# Patient Record
Sex: Female | Born: 1970 | Race: Black or African American | Hispanic: No | Marital: Single | Smoking: Former smoker
Health system: Southern US, Community
[De-identification: ages and names within clinical notes are randomized; demographics above are authoritative.]

## PROBLEM LIST (undated history)

## (undated) DIAGNOSIS — E119 Type 2 diabetes mellitus without complications: Secondary | ICD-10-CM

## (undated) HISTORY — PX: HERNIA REPAIR: SHX51

## (undated) NOTE — Anesthesia Postprocedure Evaluation (Signed)
 Formatting of this note is different from the original. Patient: Lela JONELLE Pouch  Procedure(s) Performed: Procedure(s): LEEP WITH COLPOSCOPY  Anesthesia Post Evaluation  Patient participation: Anesthetic effects expected to be resolved in 4-6 hours Level of consciousness: sleepy but conscious Pain score: 0 Airway patency: patent Cardiovascular status: stable Respiratory status: stable Hydration status: euvolemic Nausea and Vomiting: none Comments: Stable in PACU.  Anticipate routine post op course.  Will treat pain with iv or po narcotics as needed.  Quality metrics Patient Age: Adult 18 yrs + Postoperative nausea/vomiting risk protocol General inhalation anesthetic (NOT TIVA) with PONV risk factors: No  Multimodal Pain Medicine Use of Multimodal Pain Management, two or more drugs and/or interventions, not including systemic opioids? No Exception: Documented allergy to multiple classes of analgesics? No Other Unplanned admission to ICU (not initially anticipated at anesthesia start time)  No Patient was reintubated in PACU? No Patient experienced cardiac arrest? No Patient experienced Myocardial infarction? No  No complications documented.  Last Vitals:  Vitals Value Taken Time  BP 107/71 06/20/2019 10:53 AM  Temp 36.4 C 06/20/2019 10:16 AM  Pulse 100 06/20/2019 10:57 AM  Resp    SpO2 100 % 06/20/2019 10:57 AM  SpO2 Pulse Rate 100 06/20/2019 10:57 AM  Vitals shown include unvalidated device data.  Per GI nursing notes:     Electronically signed by Elsie KATHEE Bevel, MD at 06/20/2019 10:58 AM EDT

## (undated) NOTE — Anesthesia Preprocedure Evaluation (Signed)
 Formatting of this note is different from the original. 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 Pulmonary  + tobacco use  + smoker Osteomyelitis of left foot:  Cardiovascular  Patient has a negative cardiovascular ROS  Negative for hypertension Negative for hyperlipidemia Negative for CAD  GI/Hepatic/Renal  Patient has a negative GI/hepatic/renal ROS   Negative for GERD Hematology  Patient has a negative hematology ROS  Endo/Cancer/Other  + diabetes, insulin  dependent + obesity Diabetic foot ulcer (HCC):  Dental  Dental Issues: missing    Anesthesia Physical Exam Airway  Mallampati: I Pulmonary  Normal systems: pulmonary exam normal Cardiovascular  Normal systems: cardiovascular exam normal   Anesthesia Plan ASA Score: 3   Anesthesia Plan of Care:  general LMA Plan Monitor  Monitor: routine Postoperative Plan  Post Operative Plan: routine  Informed consent  Anesthetic Plan and risks discussed with the patient.  Comments: Risks and benefits of the planned anesthetic were discussed with the patient who understands and agrees to proceed.  All questions were invited and answered.  Elsie Bevel, MD  Relevant Problems   Stop Wynona Score/Comments: No value filed. Mini-Cog Score: No value filed. PONV Risk Score: No value filed. Frail Scale Score: No value filed.   Electronically signed by Elsie KATHEE Bevel, MD at 06/20/2019  9:03 AM EDT

## (undated) NOTE — Progress Notes (Signed)
 Formatting of this note is different from the original. Images from the original note were not included. Subjective:    Patient ID: Destiny Luna  MRN:  [id number] Date of Birth:  24-Apr-1971  Patient is a 20 y.o. female who presents for:  Colposcopy . HPI Destiny Luna is a 56 year old G4P1 031 who is here for consultation and colposcopy.  She was recently seen as a new patient.  She had her annual exam done in 5/20.  She had a HGSIL Pap.  She reports she had an abnormal Pap around 2001. She was unsure of what follow-up was done.  She is anxious about this.  She is not a smoker.  She also had recent lab work performed at her annual exam-she is here to review those results.  This lab work was done for issues with menstrual changes.  She was wondering if she was peri menopausal.  I have reviewed the patient's past medical history, social history, family history, medications and allergies, and made changes when deemed necessary.  Review of Systems See HPI  Objective:   BP (!) 156/97   Pulse 111   Ht 5' 5 (1.651 m)   Wt 266 lb 9.6 oz (121 kg)   LMP 04/29/2019 (Exact Date)   BMI 44.36 kg/m  Physical Exam  Genitourinary:   UPT negative FSH 9.8, estradiol 42, TSH 0.53 Pelvic:  Normal external female genitalia, normal vagina, normal cervix, normal uterus, normal adnexa See colposcopy sheet   Assessment & Plan     26 year old G81P1 031-recent menstrual changes and abnormal Pap smear   1. Menstrual changes-we reviewed her lab work.  We reviewed this is not consistent with menopause or perimenopause.  Her questions were answered.  She will monitor at this time.  We reviewed changes in her cycle to report.   2. HGSIL Pap-a colposcopy was performed today.  We reviewed the spectrum of abnormal Pap smears.  A biopsy and ECC were collected.  I will call her with these results.  Total visit time 20 minutes, time in counseling 15 minutes  Problem List Items Addressed This Visit     HGSIL on cytologic smear of cervix   Overview    5/20-pap HGSIL 6/20-colpo    Other Visit Diagnoses    Negative pregnancy test    -  Primary   Relevant Orders   POC Urine Pregnancy (Completed)   HGSIL (high grade squamous intraepithelial lesion) on Pap smear of cervix       Relevant Orders   Surgical Pathology (Completed)    Current Outpatient Medications  Medication Sig Dispense Refill  ? insulin  glargine (LANTUS ) 100 unit/mL (3 mL) InPn pen Inject 50 Units into the skin daily. 15 mL 0  ? semaglutide 1 mg/dose (2 mg/1.5 mL) PnIj Inject 1 mg into the skin every 7 days.Mondays     No current facility-administered medications for this visit.    Return if symptoms worsen or fail to improve.   Electronically signed by Alfonso JAYSON Munson, MD at 05/23/2019 10:10 AM EDT

---

## 2012-04-10 DIAGNOSIS — E1140 Type 2 diabetes mellitus with diabetic neuropathy, unspecified: Secondary | ICD-10-CM | POA: Insufficient documentation

## 2020-03-27 ENCOUNTER — Other Ambulatory Visit: Payer: Self-pay

## 2020-03-27 ENCOUNTER — Ambulatory Visit
Admission: EM | Admit: 2020-03-27 | Discharge: 2020-03-27 | Disposition: A | Discharge status: Home / Self Care | Payer: BC Managed Care – PPO | Attending: Emergency Medicine | Admitting: Emergency Medicine

## 2020-03-27 ENCOUNTER — Encounter: Payer: Self-pay | Admitting: Emergency Medicine

## 2020-03-27 DIAGNOSIS — Z833 Family history of diabetes mellitus: Secondary | ICD-10-CM | POA: Insufficient documentation

## 2020-03-27 DIAGNOSIS — J309 Allergic rhinitis, unspecified: Secondary | ICD-10-CM | POA: Diagnosis not present

## 2020-03-27 DIAGNOSIS — Z794 Long term (current) use of insulin: Secondary | ICD-10-CM | POA: Insufficient documentation

## 2020-03-27 DIAGNOSIS — K0889 Other specified disorders of teeth and supporting structures: Secondary | ICD-10-CM | POA: Diagnosis not present

## 2020-03-27 DIAGNOSIS — E119 Type 2 diabetes mellitus without complications: Secondary | ICD-10-CM | POA: Insufficient documentation

## 2020-03-27 DIAGNOSIS — F17210 Nicotine dependence, cigarettes, uncomplicated: Secondary | ICD-10-CM | POA: Insufficient documentation

## 2020-03-27 DIAGNOSIS — Z20822 Contact with and (suspected) exposure to covid-19: Secondary | ICD-10-CM | POA: Insufficient documentation

## 2020-03-27 DIAGNOSIS — R519 Headache, unspecified: Secondary | ICD-10-CM | POA: Diagnosis present

## 2020-03-27 HISTORY — DX: Type 2 diabetes mellitus without complications: E11.9

## 2020-03-27 LAB — SARS CORONAVIRUS 2 (TAT 6-24 HRS): SARS Coronavirus 2: NEGATIVE

## 2020-03-27 MED ORDER — AMOXICILLIN-POT CLAVULANATE 875-125 MG PO TABS
1.0000 | ORAL_TABLET | Freq: Two times a day (BID) | ORAL | 0 refills | Status: DC
Start: 2020-03-27 — End: 2020-09-26

## 2020-03-27 MED ORDER — LEVOCETIRIZINE DIHYDROCHLORIDE 5 MG PO TABS: 5.0000 mg | ORAL_TABLET | Freq: Every evening | ORAL | 0 refills | Status: DC

## 2020-03-27 MED ORDER — IBUPROFEN 600 MG PO TABS
600.0000 mg | ORAL_TABLET | Freq: Four times a day (QID) | ORAL | 0 refills | Status: DC | PRN
Start: 2020-03-27 — End: 2020-09-26

## 2020-03-27 MED ORDER — FLUTICASONE PROPIONATE 50 MCG/ACT NA SUSP
2.0000 | Freq: Every day | NASAL | 2 refills | Status: DC
Start: 2020-03-27 — End: 2020-09-26

## 2020-03-27 NOTE — ED Triage Notes (Signed)
Patient in today c/o sinus pressure/pain and congestion x 4 days. Patient states her symptoms are mainly on her left side. Patient has taken OTC Mucinex and Xyzal. Patient states the Xyzal helped the most. Patient denies fever. Patient denies any other symptoms.

## 2020-03-27 NOTE — Discharge Instructions (Addendum)
I have written a prescription of Xyzal, it may be cheaper getting it through insurance rather than buying over-the-counter.  Use the Flonase, saline nasal irrigation with a Lloyd Huger med rinse and distilled water as often as you want to reduce allergy symptoms and to wash out infection, add 600 mg of ibuprofen to the Tylenol that you are already taking.  You may take this combination 3 or 4 times a day as needed for pain.  Wait-and-see prescription of Augmentin.  Try giving the Xyzal, Flonase, saline nasal irrigation 24 hours to work and if your symptoms resolve, then you can hold off on the antibiotics.  If you continue to have problems, then start the Augmentin.   Here is a list of primary care providers who are taking new patients:  Dr. Elizabeth Sauer, Dr. Schuyler Amor [address] [place] [address] [phone]  [facility] [address] [place] [address]  [phone]  [facility] [address] [place], [place] [address] [phone]  [facility] [address] [place]  [phone] [place], [place] [address]  Here are clinics/ other resources who will see you if you do not have insurance. Some have certain criteria that you must meet. Call them and find out what they are:  [facility]: [address] [place], [place] [address] Phone: [phone] Hours: First and Third Saturdays of each Month, 9 a.m. - 1 p.m.  [facility]: [address], [address] [place], [place] [address] Phone: [phone] Hours: Tuesday, 4 p.m. - 8 p.m. Thursday, 1 p.m. - 8 p.m. Wednesday, 9 a.m. - [facility] [address], [place], [place] [address] Phone: [phone] Pharmacy Phone Number: [phone] Dental Phone Number: [phone] [facility] Insurance Help: [phone]  Dental Hours: Monday - Thursday, 8 a.m. - 6 p.m.  [facility] [address] [place], [place] [address] Phone:  [phone] Pharmacy Phone Number: [phone] [facility] Insurance Help: [phone]  [facility] [address] [place] [place], [place] [address] Phone: [phone] Pharmacy Phone Number: [phone] [facility] Insurance Help: [phone]  [facility] [address] [place], [place] [address] Phone: [phone] [facility] Insurance Help: [phone]   [facility] [address], [place], [place] [address] Phone: [phone]  Go to www.goodrx.com to look up your medications. This will give you a list of where you can find your prescriptions at the most affordable prices. Or ask the pharmacist what the cash price is, or if they have any other discount programs available to help make your medication more affordable. This can be less expensive than what you would pay with insurance.

## 2020-03-27 NOTE — ED Provider Notes (Signed)
HPI  SUBJECTIVE:  Destiny Luna is a 49 y.o. female who presents with 4 days of left sinus pain and pressure, left-sided headache described as throbbing.  She reports upper dental pain, itchy, watery eyes.  She reports swelling underneath her left eye this morning.  No other facial swelling.  No nasal congestion, rhinorrhea, fevers.  No sneezing.  No body aches, loss of sense of smell or taste, sore throat, cough, shortness of breath, nausea, vomiting, diarrhea, abdominal pain.  No known exposure to Covid.  She has not yet gotten the Covid vaccine.  States that she has been walking 2 hours a day, and her symptoms get worse when she is outside on high pollen days.  She has tried Xyzal and 500 mg of Tylenol 3 times daily with significant improvement in her symptoms.  Symptoms are also worse with bending forward, lying down and being outside.  No antibiotics in the past month.  No antipyretic in the past 4 to 6 hours.  Past medical history of diabetes.  No history of hypertension, allergies, frequent sinusitis.  LMP: 2 weeks ago.  Denies the possibility of being pregnant.  PMD: Dr. Vivia Birmingham in [facility].    Past Medical History:  Diagnosis Date  . Diabetes mellitus without complication [facility])     Past Surgical History:  Procedure Laterality Date  . CESAREAN SECTION    . HERNIA REPAIR      Family History  Problem Relation Age of Onset  . Diabetes Mother   . CAD Father        CABG x 3  . Stroke Father   . Diabetes Father     Social History   Tobacco Use  . Smoking status: Current Every Day Smoker    Packs/day: 0.25    Years: 4.00    Pack years: 1.00    Types: Cigarettes  . Smokeless tobacco: Never Used  . Tobacco comment: quit for 8 years and restarted  Substance Use Topics  . Alcohol use: Yes    Comment: rarely  . Drug use: Never    No current facility-administered medications for this encounter.  Current Outpatient Medications:  .  insulin glargine (LANTUS)  100 UNIT/ML injection, Inject 25 Units into the skin daily. 25-30 units, Disp: , Rfl:  .  Semaglutide (OZEMPIC, 1 MG/DOSE, [place]), Inject 1 each into the skin once a week., Disp: , Rfl:  .  amoxicillin-clavulanate (AUGMENTIN) 875-125 MG tablet, Take 1 tablet by mouth 2 (two) times daily. X 7 days, Disp: 14 tablet, Rfl: 0 .  fluticasone (FLONASE) 50 MCG/ACT nasal spray, Place 2 sprays into both nostrils daily., Disp: 16 g, Rfl: 2 .  ibuprofen (ADVIL) 600 MG tablet, Take 1 tablet (600 mg total) by mouth every 6 (six) hours as needed., Disp: 30 tablet, Rfl: 0 .  levocetirizine (XYZAL) 5 MG tablet, Take 1 tablet (5 mg total) by mouth every evening., Disp: 30 tablet, Rfl: 0  No Known Allergies   ROS  As noted in HPI.   Physical Exam  BP (!) 136/93 (BP Location: Left Arm)   Pulse (!) 107   Temp 98.9 F (37.2 C) (Oral)   Resp 18   Ht 5' 5.5" (1.664 m)   Wt 113.4 kg   LMP 03/13/2020 (Approximate)   SpO2 98%   BMI 40.97 kg/m   Constitutional: Well developed, well nourished, no acute distress Eyes:  EOMI, conjunctiva normal bilaterally HENT: Normocephalic, atraumatic,mucus membranes moist.  Normal turbinates.  No frontal sinus  tenderness.  Positive left-sided maxillary sinus tenderness.  No maxillary sinus tenderness right side.  Positive mucoid nasal congestion left side. Respiratory: Normal inspiratory effort Cardiovascular: Normal rate GI: nondistended skin: No rash, skin intact Musculoskeletal: no deformities Neurologic: Alert & oriented x 3, no focal neuro deficits Psychiatric: Speech and behavior appropriate   ED Course   Medications - No data to display  Orders Placed This Encounter  Procedures  . SARS CORONAVIRUS 2 (TAT 6-24 HRS) Nasopharyngeal Nasopharyngeal Swab    Standing Status:   Standing    Number of Occurrences:   1    Order Specific Question:   Is this test for diagnosis or screening    Answer:   Diagnosis of ill patient    Order Specific Question:    Symptomatic for COVID-19 as defined by CDC    Answer:   Yes    Order Specific Question:   Date of Symptom Onset    Answer:   03/23/2020    Order Specific Question:   Hospitalized for COVID-19    Answer:   No    Order Specific Question:   Admitted to ICU for COVID-19    Answer:   No    Order Specific Question:   Previously tested for COVID-19    Answer:   No    Order Specific Question:   Resident in a congregate (group) care setting    Answer:   No    Order Specific Question:   Employed in healthcare setting    Answer:   No    Order Specific Question:   Pregnant    Answer:   No    Order Specific Question:   Has patient completed COVID vaccination(s) (2 doses of Pfizer/Moderna 1 dose of [employer])    Answer:   No    No results found for this or any previous visit (from the past 24 hour(s)). No results found.  ED Clinical Impression  1. Allergic sinusitis      ED Assessment/Plan  Covid PCR sent.  Discussed with patient that we will contact her only if positive.  Patient with a left-sided sinusitis, seems to be caused by allergies.  She responds well to the Xyzal also we will have her continue this 5 mg/day.  Sending home with Flonase, saline nasal irrigation with a Milta Deiters med rinse and distilled water, ibuprofen 600 mg to add to the Tylenol 3-4 times a day as needed.  Wait-and-see prescription of Augmentin.  She will give the medication 24 hours to work, and if her symptoms resolve, then she can hold off on the antibiotics, however if she continues to have symptoms, she is to start the Augmentin.  Will provide primary care list.   Discussed  MDM, treatment plan, and plan for follow-up with patient. Discussed sn/sx that should prompt return to the ED. patient agrees with plan.   Meds ordered this encounter  Medications  . levocetirizine (XYZAL) 5 MG tablet    Sig: Take 1 tablet (5 mg total) by mouth every evening.    Dispense:  30 tablet    Refill:  0  . fluticasone  (FLONASE) 50 MCG/ACT nasal spray    Sig: Place 2 sprays into both nostrils daily.    Dispense:  16 g    Refill:  2  . amoxicillin-clavulanate (AUGMENTIN) 875-125 MG tablet    Sig: Take 1 tablet by mouth 2 (two) times daily. X 7 days    Dispense:  14 tablet  Refill:  0  . ibuprofen (ADVIL) 600 MG tablet    Sig: Take 1 tablet (600 mg total) by mouth every 6 (six) hours as needed.    Dispense:  30 tablet    Refill:  0    *This clinic note was created using Scientist, clinical (histocompatibility and immunogenetics). Therefore, there may be occasional mistakes despite careful proofreading.   ?    Domenick Gong, MD 03/27/20 1216

## 2020-04-27 ENCOUNTER — Emergency Department: Payer: BC Managed Care – PPO

## 2020-04-27 ENCOUNTER — Encounter: Payer: Self-pay | Admitting: Emergency Medicine

## 2020-04-27 DIAGNOSIS — F17210 Nicotine dependence, cigarettes, uncomplicated: Secondary | ICD-10-CM | POA: Insufficient documentation

## 2020-04-27 DIAGNOSIS — U071 COVID-19: Secondary | ICD-10-CM | POA: Diagnosis not present

## 2020-04-27 DIAGNOSIS — Z794 Long term (current) use of insulin: Secondary | ICD-10-CM | POA: Insufficient documentation

## 2020-04-27 DIAGNOSIS — Z79899 Other long term (current) drug therapy: Secondary | ICD-10-CM | POA: Insufficient documentation

## 2020-04-27 DIAGNOSIS — E1165 Type 2 diabetes mellitus with hyperglycemia: Secondary | ICD-10-CM | POA: Insufficient documentation

## 2020-04-27 DIAGNOSIS — R5383 Other fatigue: Secondary | ICD-10-CM | POA: Diagnosis present

## 2020-04-27 NOTE — ED Triage Notes (Signed)
Pt c/o cough, nasal congestion, HA, general malaise x5 days with no relief by OTC medications. Pt denies SOB and chest pain.

## 2020-04-28 ENCOUNTER — Emergency Department
Admission: EM | Admit: 2020-04-28 | Discharge: 2020-04-28 | Disposition: A | Discharge status: Home / Self Care | Payer: BC Managed Care – PPO | Attending: Emergency Medicine | Admitting: Emergency Medicine

## 2020-04-28 DIAGNOSIS — U071 COVID-19: Secondary | ICD-10-CM

## 2020-04-28 DIAGNOSIS — E1165 Type 2 diabetes mellitus with hyperglycemia: Secondary | ICD-10-CM

## 2020-04-28 LAB — COMPREHENSIVE METABOLIC PANEL
ALT: 14 U/L (ref 0–44)
AST: 13 U/L — ABNORMAL LOW (ref 15–41)
Albumin: 3.2 g/dL — ABNORMAL LOW (ref 3.5–5.0)
Alkaline Phosphatase: 79 U/L (ref 38–126)
Anion gap: 11 (ref 5–15)
BUN: 14 mg/dL (ref 6–20)
CO2: 21 mmol/L — ABNORMAL LOW (ref 22–32)
Calcium: 7.9 mg/dL — ABNORMAL LOW (ref 8.9–10.3)
Chloride: 94 mmol/L — ABNORMAL LOW (ref 98–111)
Creatinine, Ser: 0.92 mg/dL (ref 0.44–1.00)
GFR calc Af Amer: >60 mL/min (ref >60)
GFR calc non Af Amer: >60 mL/min (ref >60)
Glucose, Bld: 436 mg/dL — ABNORMAL HIGH (ref 70–99)
Potassium: 3.8 mmol/L (ref 3.5–5.1)
Sodium: 126 mmol/L — ABNORMAL LOW (ref 135–145)
Total Bilirubin: 0.9 mg/dL (ref 0.3–1.2)
Total Protein: 6.8 g/dL (ref 6.5–8.1)

## 2020-04-28 LAB — CBC WITH DIFFERENTIAL/PLATELET
Abs Immature Granulocytes: 0.03 10*3/uL (ref 0.00–0.07)
Basophils Absolute: 0 10*3/uL (ref 0.0–0.1)
Basophils Relative: 0 %
Eosinophils Absolute: 0 10*3/uL (ref 0.0–0.5)
Eosinophils Relative: 0 %
HCT: 41.8 % (ref 36.0–46.0)
Hemoglobin: 13.6 g/dL (ref 12.0–15.0)
Immature Granulocytes: 0 %
Lymphocytes Relative: 23 %
Lymphs Abs: 1.6 10*3/uL (ref 0.7–4.0)
MCH: 26.9 pg (ref 26.0–34.0)
MCHC: 32.5 g/dL (ref 30.0–36.0)
MCV: 82.6 fL (ref 80.0–100.0)
Monocytes Absolute: 0.6 10*3/uL (ref 0.1–1.0)
Monocytes Relative: 9 %
Neutro Abs: 4.8 10*3/uL (ref 1.7–7.7)
Neutrophils Relative %: 68 %
Platelets: 178 10*3/uL (ref 150–400)
RBC: 5.06 MIL/uL (ref 3.87–5.11)
RDW: 13.1 % (ref 11.5–15.5)
WBC: 7.1 10*3/uL (ref 4.0–10.5)
nRBC: 0 % (ref 0.0–0.2)

## 2020-04-28 LAB — GLUCOSE, CAPILLARY
Glucose-Capillary: 396 mg/dL — ABNORMAL HIGH (ref 70–99)
Glucose-Capillary: 366 mg/dL — ABNORMAL HIGH (ref 70–99)
Glucose-Capillary: 279 mg/dL — ABNORMAL HIGH (ref 70–99)

## 2020-04-28 LAB — PROCALCITONIN: Procalcitonin: <0.1 ng/mL

## 2020-04-28 LAB — SARS CORONAVIRUS 2 BY RT PCR (HOSPITAL ORDER, PERFORMED IN ~~LOC~~ HOSPITAL LAB): SARS Coronavirus 2: POSITIVE — AB

## 2020-04-28 MED ORDER — ACETAMINOPHEN 500 MG PO TABS
1000.0000 mg | ORAL_TABLET | Freq: Once | ORAL | Status: AC
  Administered 2020-04-28: 1000 mg via ORAL
  Filled 2020-04-28: qty 2

## 2020-04-28 MED ORDER — INSULIN ASPART 100 UNIT/ML ~~LOC~~ SOLN
10.0000 [IU] | Freq: Once | SUBCUTANEOUS | Status: AC
  Administered 2020-04-28: 10 [IU] via INTRAVENOUS
  Filled 2020-04-28: qty 1

## 2020-04-28 MED ORDER — LACTATED RINGERS IV BOLUS
1000.0000 mL | Freq: Once | INTRAVENOUS | Status: AC
  Administered 2020-04-28: 1000 mL via INTRAVENOUS

## 2020-04-28 NOTE — ED Notes (Signed)
Dr Don Perking notified of Positive COVID

## 2020-04-28 NOTE — ED Notes (Signed)
CBG 366 

## 2020-04-28 NOTE — ED Notes (Signed)
Pt assisted to the restroom at this time. ?

## 2020-04-28 NOTE — ED Notes (Signed)
Pt st she needs to leave right now and cannot wait since her ride has been in the car all night long. Pt asked this RN to please remove the IV.  IV removed at this time @0513    Pt does not want to wait for d/c papers/ or have VS re-taken. MD made aware

## 2020-04-28 NOTE — ED Provider Notes (Addendum)
[facility] Emergency Department Provider Note  ____________________________________________  Time seen: Approximately 1:34 AM  I have reviewed the triage vital signs and the nursing notes.   HISTORY  Chief Complaint Fatigue, Cough, and Diarrhea   HPI Destiny Luna is a 49 y.o. female with a history of diabetes who presents for evaluation of Covid-like symptoms x5 days.  No known exposure.  Patient is complaining of fever, headache, malaise, body aches, congestion, cough, sore throat, diarrhea.  Her symptoms have been constant and moderate in intensity.  No chest pain or shortness of breath, no abdominal pain, no nausea or vomiting.  No Covid vaccination.   Past Medical History:  Diagnosis Date  . Diabetes mellitus without complication [facility])     Past Surgical History:  Procedure Laterality Date  . CESAREAN SECTION    . HERNIA REPAIR      Prior to Admission medications   Medication Sig Start Date End Date Taking? Authorizing Provider  amoxicillin-clavulanate (AUGMENTIN) 875-125 MG tablet Take 1 tablet by mouth 2 (two) times daily. X 7 days 03/27/20   Domenick Gong, MD  fluticasone [facility]) 50 MCG/ACT nasal spray Place 2 sprays into both nostrils daily. 03/27/20   Domenick Gong, MD  ibuprofen (ADVIL) 600 MG tablet Take 1 tablet (600 mg total) by mouth every 6 (six) hours as needed. 03/27/20   Domenick Gong, MD  insulin glargine (LANTUS) 100 UNIT/ML injection Inject 25 Units into the skin daily. 25-30 units    [provider]  levocetirizine (XYZAL) 5 MG tablet Take 1 tablet (5 mg total) by mouth every evening. 03/27/20 04/26/20  Domenick Gong, MD  Semaglutide (OZEMPIC, 1 MG/DOSE, [place]) Inject 1 each into the skin once a week.    [provider]    Allergies Patient has no known allergies.  Family History  Problem Relation Age of Onset  . Diabetes Mother   . CAD Father        CABG x 3  . Stroke Father   . Diabetes  Father     Social History Social History   Tobacco Use  . Smoking status: Current Every Day Smoker    Packs/day: 0.25    Years: 4.00    Pack years: 1.00    Types: Cigarettes  . Smokeless tobacco: Never Used  . Tobacco comment: quit for 8 years and restarted  Substance Use Topics  . Alcohol use: Yes    Comment: rarely  . Drug use: Never    Review of Systems  Constitutional: + fever, malaise, body aches Eyes: Negative for visual changes. ENT: + sore throat, congestion Neck: No neck pain  Cardiovascular: Negative for chest pain. Respiratory: Negative for shortness of breath. + cough Gastrointestinal: Negative for abdominal pain, vomiting. + diarrhea. Genitourinary: Negative for dysuria. Musculoskeletal: Negative for back pain. Skin: Negative for rash. Neurological: Negative for weakness or numbness. + HA Psych: No SI or HI  ____________________________________________   PHYSICAL EXAM:  VITAL SIGNS: ED Triage Vitals  Enc Vitals Group     BP 04/27/20 2121 125/77     Pulse Rate 04/27/20 2121 (!) 113     Resp 04/27/20 2125 18     Temp 04/27/20 2121 (!) 100.6 F (38.1 C)     Temp Source 04/27/20 2121 Oral     SpO2 04/27/20 2121 96 %     Weight 04/27/20 2121 240 lb (108.9 kg)     Height 04/27/20 2121 5\' 5"  (1.651 m)     Head Circumference --  Peak Flow --      Pain Score --      Pain Loc --      Pain Edu? --      Excl. in [place]? --     Constitutional: Alert and oriented. Well appearing and in no apparent distress. HEENT:      Head: Normocephalic and atraumatic.         Eyes: Conjunctivae are normal. Sclera is non-icteric.       Mouth/Throat: Mucous membranes are moist.       Neck: Supple with no signs of meningismus. Cardiovascular: Tachycardic with regular rhythm. Respiratory: Normal respiratory effort. Lungs are clear to auscultation bilaterally. No wheezes, crackles, or rhonchi.  Gastrointestinal: Soft, non tender, and non distended with positive bowel  sounds. Musculoskeletal:  No edema, cyanosis, or erythema of extremities. Neurologic: Normal speech and language. Face is symmetric. Moving all extremities. No gross focal neurologic deficits are appreciated. Skin: Skin is warm, dry and intact. No rash noted. Psychiatric: Mood and affect are normal. Speech and behavior are normal.  ____________________________________________   LABS (all labs ordered are listed, but only abnormal results are displayed)  Labs Reviewed  SARS CORONAVIRUS 2 BY RT PCR (HOSPITAL ORDER, [facility]) - Abnormal; Notable for the following components:      Result Value   SARS Coronavirus 2 POSITIVE (*)    All other components within normal limits  COMPREHENSIVE METABOLIC PANEL - Abnormal; Notable for the following components:   Sodium 126 (*)    Chloride 94 (*)    CO2 21 (*)    Glucose, Bld 436 (*)    Calcium 7.9 (*)    Albumin 3.2 (*)    AST 13 (*)    All other components within normal limits  GLUCOSE, CAPILLARY - Abnormal; Notable for the following components:   Glucose-Capillary 396 (*)    All other components within normal limits  GLUCOSE, CAPILLARY - Abnormal; Notable for the following components:   Glucose-Capillary 366 (*)    All other components within normal limits  GLUCOSE, CAPILLARY - Abnormal; Notable for the following components:   Glucose-Capillary 279 (*)    All other components within normal limits  CBC WITH DIFFERENTIAL/PLATELET  PROCALCITONIN   ____________________________________________  EKG  none  ____________________________________________  RADIOLOGY  I have personally reviewed the images performed during this visit and I agree with the Radiologist's read.   Interpretation by Radiologist:  DG Chest 2 View  Result Date: 04/27/2020 CLINICAL DATA:  Cough for several days EXAM: CHEST - 2 VIEW COMPARISON:  None. FINDINGS: Cardiac shadows within normal limits. The lungs are well aerated bilaterally.  Minimal lingular infiltrate is seen. No sizable effusion is noted. No bony abnormality is noted. IMPRESSION: Mild lingular infiltrate. Electronically Signed   By: Inez Catalina M.D.   On: 04/27/2020 22:01     ____________________________________________   PROCEDURES  Procedure(s) performed:yes .1-3 Lead EKG Interpretation Performed by: Rudene Re, MD Authorized by: Rudene Re, MD     Interpretation: normal     ECG rate assessment: normal     Rhythm: sinus rhythm     Ectopy: none     Critical Care performed:  None ____________________________________________   INITIAL IMPRESSION / ASSESSMENT AND PLAN / ED COURSE  49 y.o. female with a history of diabetes who presents for evaluation of Covid-like symptoms x5 days.  Patient is well-appearing and in no distress.  Initially mild tachycardic and febrile however tachycardia resolved once patient defervesced.  normal work of breathing and normal sats both at rest and with ambulation.  Exam is nonfocal.  Chest x-ray interpreted by me as a left lingular infiltrate, confirmed by radiology.  Patient does not meet sepsis criteria.  Normal white count.  Labs otherwise pending including chemistry panel and procalcitonin.  Covid swab pending.  Will give IV fluids and Tylenol for symptom relief.  Old medical records reviewed.  _________________________ 5:16 AM on 04/28/2020 ----------------------------------------- Positive for Covid.  Procalcitonin negative therefore patient was not started on antibiotics.  She remained with no shortness of breath or chest pain, normal work of breathing, normal sats.  Labs showing hyperglycemia no evidence of DKA.  Sugar trend down nicely after fluids and insulin.  Discussed quarantine and supportive care with patient and follow-up with PCP.  Discussed my standard return precautions.  Discussed oxygen monitoring at home and quarantine     _____________________________________________ Please note:   Patient was evaluated in Emergency Department today for the symptoms described in the history of present illness. Patient was evaluated in the context of the global COVID-19 pandemic, which necessitated consideration that the patient might be at risk for infection with the SARS-CoV-2 virus that causes COVID-19. Institutional protocols and algorithms that pertain to the evaluation of patients at risk for COVID-19 are in a state of rapid change based on information released by regulatory bodies including the CDC and federal and state organizations. These policies and algorithms were followed during the patient's care in the ED.  Some ED evaluations and interventions may be delayed as a result of limited staffing during the pandemic.   [place] Controlled Substance Database was reviewed by me. ____________________________________________   FINAL CLINICAL IMPRESSION(S) / ED DIAGNOSES   Final diagnoses:  COVID-19  Type 2 diabetes mellitus with hyperglycemia, unspecified whether long term insulin use (HCC)      NEW MEDICATIONS STARTED DURING THIS VISIT:  ED Discharge Orders    None       Note:  This document was prepared using Dragon voice recognition software and may include unintentional dictation errors.    Don Perking, [place], MD 04/28/20 [id number]    Nita Sickle, MD 04/28/20 [phone]

## 2020-04-28 NOTE — ED Notes (Signed)
Pt c/o cough, congestion, nasal congestion x5 days.  Dr. Don Perking at bedside.  Pt in NAD at this time

## 2020-04-29 ENCOUNTER — Other Ambulatory Visit: Payer: Self-pay | Admitting: Physician Assistant

## 2020-04-29 ENCOUNTER — Ambulatory Visit (HOSPITAL_COMMUNITY)
Admission: RE | Admit: 2020-04-29 | Discharge: 2020-04-29 | Disposition: A | Discharge status: Home / Self Care | Payer: BC Managed Care – PPO | Source: Ambulatory Visit | Attending: Pulmonary Disease | Admitting: Pulmonary Disease

## 2020-04-29 DIAGNOSIS — U071 COVID-19: Secondary | ICD-10-CM

## 2020-04-29 DIAGNOSIS — E119 Type 2 diabetes mellitus without complications: Secondary | ICD-10-CM

## 2020-04-29 MED ORDER — SODIUM CHLORIDE 0.9 % IV SOLN
Freq: Once | INTRAVENOUS | Status: DC
  Filled 2020-04-29: qty 20

## 2020-04-29 NOTE — Progress Notes (Signed)
  I connected by phone with Destiny Luna on 04/29/2020 at 7:41 AM to discuss the potential use of an new treatment for mild to moderate COVID-19 viral infection in non-hospitalized patients.  This patient is a 49 y.o. female that meets the FDA criteria for Emergency Use Authorization of bamlanivimab/etesevimab or casirivimab/imdevimab.  Has a (+) direct SARS-CoV-2 viral test result  Has mild or moderate COVID-19   Is NOT hospitalized due to COVID-19  Is within 10 days of symptom onset  Has at least one of the high risk factor(s) for progression to severe COVID-19 and/or hospitalization as defined in EUA.  Specific high risk criteria : Diabetes, BMI >25   I have spoken and communicated the following to the patient or parent/caregiver:  1. FDA has authorized the emergency use of bamlanivimab/etesevimab and casirivimab\imdevimab for the treatment of mild to moderate COVID-19 in adults and pediatric patients with positive results of direct SARS-CoV-2 viral testing who are 47 years of age and older weighing at least 40 kg, and who are at high risk for progressing to severe COVID-19 and/or hospitalization.  2. The significant known and potential risks and benefits of bamlanivimab/etesevimab and casirivimab\imdevimab, and the extent to which such potential risks and benefits are unknown.  3. Information on available alternative treatments and the risks and benefits of those alternatives, including clinical trials.  4. Patients treated with bamlanivimab/etesevimab and casirivimab\imdevimab should continue to self-isolate and use infection control measures (e.g., wear mask, isolate, social distance, avoid sharing personal items, clean and disinfect "high touch" surfaces, and frequent handwashing) according to CDC guidelines.   5. The patient or parent/caregiver has the option to accept or refuse bamlanivimab/etesevimab or casirivimab\imdevimab .  After reviewing this information with the  patient, The patient agreed to proceed with receiving the bamlanivimab/etesevimab infusion and will be provided a copy of the Fact sheet prior to receiving the infusion..  Sx onset 5/27. Set up for infusion this afternoon at 12:30pm. Directions given  Cline Crock 04/29/2020 7:41 AM

## 2020-04-30 ENCOUNTER — Ambulatory Visit (HOSPITAL_COMMUNITY)
Admission: RE | Admit: 2020-04-30 | Discharge: 2020-04-30 | Disposition: A | Discharge status: Home / Self Care | Payer: BC Managed Care – PPO | Source: Ambulatory Visit | Attending: Pulmonary Disease | Admitting: Pulmonary Disease

## 2020-04-30 DIAGNOSIS — U071 COVID-19: Secondary | ICD-10-CM | POA: Insufficient documentation

## 2020-04-30 DIAGNOSIS — E119 Type 2 diabetes mellitus without complications: Secondary | ICD-10-CM | POA: Insufficient documentation

## 2020-04-30 MED ORDER — METHYLPREDNISOLONE SODIUM SUCC 125 MG IJ SOLR: 125.0000 mg | Freq: Once | INTRAMUSCULAR | Status: DC | PRN

## 2020-04-30 MED ORDER — EPINEPHRINE 0.3 MG/0.3ML IJ SOAJ: 0.3000 mg | Freq: Once | INTRAMUSCULAR | Status: DC | PRN

## 2020-04-30 MED ORDER — SODIUM CHLORIDE 0.9 % IV SOLN: INTRAVENOUS | Status: DC | PRN

## 2020-04-30 MED ORDER — FAMOTIDINE IN NACL 20-0.9 MG/50ML-% IV SOLN: 20.0000 mg | Freq: Once | INTRAVENOUS | Status: DC | PRN

## 2020-04-30 MED ORDER — ALBUTEROL SULFATE HFA 108 (90 BASE) MCG/ACT IN AERS: 2.0000 | INHALATION_SPRAY | Freq: Once | RESPIRATORY_TRACT | Status: DC | PRN

## 2020-04-30 MED ORDER — DIPHENHYDRAMINE HCL 50 MG/ML IJ SOLN: 50.0000 mg | Freq: Once | INTRAMUSCULAR | Status: DC | PRN

## 2020-04-30 MED ORDER — SODIUM CHLORIDE 0.9 % IV SOLN
Freq: Once | INTRAVENOUS | Status: AC
  Filled 2020-04-30: qty 20

## 2020-04-30 NOTE — Progress Notes (Signed)
  Diagnosis: COVID-19  Physician:Dr Wright  Procedure: Covid Infusion Clinic Med: bamlanivimab\etesevimab infusion - Provided patient with bamlanimivab\etesevimab fact sheet for patients, parents and caregivers prior to infusion.  Complications: No immediate complications noted.  Discharge: Discharged home   Destiny Luna 04/30/2020  

## 2020-04-30 NOTE — Discharge Instructions (Signed)

## 2020-06-02 ENCOUNTER — Encounter: Payer: Self-pay | Admitting: Podiatry

## 2020-06-02 ENCOUNTER — Ambulatory Visit (INDEPENDENT_AMBULATORY_CARE_PROVIDER_SITE_OTHER): Payer: BC Managed Care – PPO | Admitting: Podiatry

## 2020-06-02 ENCOUNTER — Other Ambulatory Visit: Payer: Self-pay

## 2020-06-02 DIAGNOSIS — E1165 Type 2 diabetes mellitus with hyperglycemia: Secondary | ICD-10-CM

## 2020-06-02 DIAGNOSIS — IMO0002 Reserved for concepts with insufficient information to code with codable children: Secondary | ICD-10-CM

## 2020-06-02 DIAGNOSIS — L84 Corns and callosities: Secondary | ICD-10-CM | POA: Diagnosis not present

## 2020-06-02 DIAGNOSIS — E118 Type 2 diabetes mellitus with unspecified complications: Secondary | ICD-10-CM | POA: Diagnosis not present

## 2020-06-02 NOTE — Progress Notes (Signed)
Subjective:  Patient ID: Destiny Luna, female    DOB: 1971-08-04,  MRN: [id number]  Chief Complaint  Patient presents with   Foot Pain    pt is here for a possible callus on the right big toe, pt states that she also has a mole on top of the left foot.    49 y.o. female presents with the above complaint.  Patient presents with concern for right hallux hyperkeratotic lesion that has been going on for quite some time.  Patient has recently moved down here from [place] and is seeking for a new podiatrist.  Patient states that callus formation has been going on for quite some time.  Patient has history of ulcerations.  He has been going for about a year.  Patient states that it is not painful to walk on.  Her A1c is 14 and she is working on bring it down.  She would like to know if there is an underlying ulceration present.  She is concerned as she has a history of previous osteomyelitis that has been treated.  She denies any other acute complaints.   Review of Systems: Negative except as noted in the HPI. Denies N/V/F/Ch.  Past Medical History:  Diagnosis Date   Diabetes mellitus without complication (HCC)     Current Outpatient Medications:    amoxicillin (AMOXIL) 500 MG capsule, amoxicillin 500 mg capsule  TAKE 1 CAPSULE BY MOUTH EVERY 12 HOURS, Disp: , Rfl:    amoxicillin-clavulanate (AUGMENTIN) 875-125 MG tablet, Take 1 tablet by mouth 2 (two) times daily. X 7 days, Disp: 14 tablet, Rfl: 0   cephALEXin (KEFLEX) 500 MG capsule, cephalexin 500 mg capsule, Disp: , Rfl:    fluticasone (FLONASE) 50 MCG/ACT nasal spray, Place 2 sprays into both nostrils daily., Disp: 16 g, Rfl: 2   ibuprofen (ADVIL) 600 MG tablet, Take 1 tablet (600 mg total) by mouth every 6 (six) hours as needed., Disp: 30 tablet, Rfl: 0   insulin glargine (LANTUS) 100 UNIT/ML injection, Inject 25 Units into the skin daily. 25-30 units, Disp: , Rfl:    Semaglutide (OZEMPIC, 1 MG/DOSE, [place]), Inject 1 each into  the skin once a week., Disp: , Rfl:    levocetirizine (XYZAL) 5 MG tablet, Take 1 tablet (5 mg total) by mouth every evening., Disp: 30 tablet, Rfl: 0  Social History   Tobacco Use  Smoking Status Current Every Day Smoker   Packs/day: 0.25   Years: 4.00   Pack years: 1.00   Types: Cigarettes  Smokeless Tobacco Never Used  Tobacco Comment   quit for 8 years and restarted    No Known Allergies Objective:  There were no vitals filed for this visit. There is no height or weight on file to calculate BMI. Constitutional Well developed. Well nourished.  Vascular Dorsalis pedis pulses palpable bilaterally. Posterior tibial pulses palpable bilaterally. Capillary refill normal to all digits.  No cyanosis or clubbing noted. Pedal hair growth normal.  Neurologic Normal speech. Oriented to person, place, and time. Epicritic sensation to light touch grossly present bilaterally.  Dermatologic  right hallux hyperkeratotic lesion noted at the plantar aspect of the hallux.  Upon debridement no pinpoint bleeding noted.  No underlying ulceration noted.  No clinical signs of infection or abscess formation noted.  No soft tissue emphysema or crepitus noted.  Orthopedic: Normal joint ROM without pain or crepitus bilaterally. No visible deformities. No bony tenderness.   Radiographs: None Assessment:   1. Pre-ulcerative calluses   2. Diabetes mellitus  type 2 with complications, uncontrolled (HCC)    Plan:  Patient was evaluated and treated and all questions answered.  Right hallux preulcerative ulceration in the setting of diabetes -I explained to patient the etiology of preulcerative ulceration secondary to gait mechanics leading to excessive pressure.  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  However given her A1c patient is not ideal candidate for elective surgery/prophylactic surgery  to prevent ulceration.  I have encouraged her to bring her A1c down below 8 and will consider surgical intervention at that time. -Using chisel blade and hand of the lesion was aggressively debrided down to healthy striated tissue.  No underlying wound noted.  No pinpoint bleeding noted.  No follow-ups on file.

## 2020-07-09 ENCOUNTER — Other Ambulatory Visit: Payer: Self-pay

## 2020-07-09 ENCOUNTER — Ambulatory Visit (INDEPENDENT_AMBULATORY_CARE_PROVIDER_SITE_OTHER): Payer: BC Managed Care – PPO | Admitting: Podiatry

## 2020-07-09 DIAGNOSIS — L97512 Non-pressure chronic ulcer of other part of right foot with fat layer exposed: Secondary | ICD-10-CM

## 2020-07-09 DIAGNOSIS — E1165 Type 2 diabetes mellitus with hyperglycemia: Secondary | ICD-10-CM

## 2020-07-09 DIAGNOSIS — L97522 Non-pressure chronic ulcer of other part of left foot with fat layer exposed: Secondary | ICD-10-CM

## 2020-07-09 DIAGNOSIS — Z7189 Other specified counseling: Secondary | ICD-10-CM

## 2020-07-09 DIAGNOSIS — IMO0002 Reserved for concepts with insufficient information to code with codable children: Secondary | ICD-10-CM

## 2020-07-09 DIAGNOSIS — L84 Corns and callosities: Secondary | ICD-10-CM

## 2020-07-09 DIAGNOSIS — E118 Type 2 diabetes mellitus with unspecified complications: Secondary | ICD-10-CM

## 2020-07-09 MED ORDER — DOXYCYCLINE HYCLATE 100 MG PO TABS
100.0000 mg | ORAL_TABLET | Freq: Two times a day (BID) | ORAL | 0 refills | Status: DC
Start: 2020-07-09 — End: 2020-09-26

## 2020-07-10 ENCOUNTER — Encounter: Payer: Self-pay | Admitting: Podiatry

## 2020-07-10 NOTE — Progress Notes (Signed)
Subjective:  Patient ID: Destiny Luna, female    DOB: 05-Oct-1971,  MRN: [id number]  Chief Complaint  Patient presents with  . Foot Pain    49 y.o. female presents with the above complaint. Patient presents with new complaint of bilateral 2nd 3rd and 4th digit ulceration secondary to shoe gear modification that she made. She states that the shoe rubbed against top of her toes leading to pressure and ulceration. Given that she is diabetic with neuropathy she will did not feel it. She states that it started causing the ulcer started oozing some purulent drainage. She states that she does not have any systemic signs of infection. There is no redness associated with it. 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 She denies any other acute complaints. She has not seen anyone else prior to seeing me.   Review of Systems: Negative except as noted in the HPI. Denies N/V/F/Ch.  Past Medical History:  Diagnosis Date  . Diabetes mellitus without complication (HCC)     Current Outpatient Medications:  .  amoxicillin (AMOXIL) 500 MG capsule, amoxicillin 500 mg capsule  TAKE 1 CAPSULE BY MOUTH EVERY 12 HOURS, Disp: , Rfl:  .  amoxicillin-clavulanate (AUGMENTIN) 875-125 MG tablet, Take 1 tablet by mouth 2 (two) times daily. X 7 days, Disp: 14 tablet, Rfl: 0 .  cephALEXin (KEFLEX) 500 MG capsule, cephalexin 500 mg capsule, Disp: , Rfl:  .  doxycycline (VIBRA-TABS) 100 MG tablet, Take 1 tablet (100 mg total) by mouth 2 (two) times daily., Disp: 20 tablet, Rfl: 0 .  fluticasone (FLONASE) 50 MCG/ACT nasal spray, Place 2 sprays into both nostrils daily., Disp: 16 g, Rfl: 2 .  ibuprofen (ADVIL) 600 MG tablet, Take 1 tablet (600 mg total) by mouth every 6 (six) hours as needed., Disp: 30 tablet, Rfl: 0 .  insulin glargine (LANTUS) 100 UNIT/ML injection, Inject 25 Units into the skin daily. 25-30 units, Disp: , Rfl:  .  levocetirizine (XYZAL) 5 MG tablet, Take 1 tablet (5 mg total) by  mouth every evening., Disp: 30 tablet, Rfl: 0 .  Semaglutide (OZEMPIC, 1 MG/DOSE, [place]), Inject 1 each into the skin once a week., Disp: , Rfl:   Social History   Tobacco Use  Smoking Status Current Every Day Smoker  . Packs/day: 0.25  . Years: 4.00  . Pack years: 1.00  . Types: Cigarettes  Smokeless Tobacco Never Used  Tobacco Comment   quit for 8 years and restarted    No Known Allergies Objective:  There were no vitals filed for this visit. There is no height or weight on file to calculate BMI. Constitutional Well developed. Well nourished.  Vascular Dorsalis pedis pulses palpable bilaterally. Posterior tibial pulses palpable bilaterally. Capillary refill normal to all digits.  No cyanosis or clubbing noted. Pedal hair growth normal.  Neurologic Normal speech. Oriented to person, place, and time. Epicritic sensation to light touch grossly present bilaterally.  Dermatologic Nails well groomed and normal in appearance. No open wounds. No skin lesions.  Orthopedic:  Bilateral 2nd, 3rd, 4th digit ulceration with fat layer exposed. Superficial purulent drainage was expressed after debridement. Does not probe down to bone. Fibrogranular wounds. No maceration present. Hyperkeratotic periwound noted. Hammertoe contracture semiflexible in nature noted to bilateral 2 through 5 digits.   Radiographs: None Assessment:   1. Diabetes mellitus type 2 with complications, uncontrolled (HCC)   2. Pre-ulcerative calluses   3. Right foot ulcer, with fat layer exposed (HCC)   4. Foot  ulcer with fat layer exposed, left (HCC)   5. Encounter for diabetes education    Plan:  Patient was evaluated and treated and all questions answered.    Ulcer *Bilateral 2nd 3rd and 4th dorsal digit ulceration with fat layer exposed -Debridement as below. -Dressed with Betadine wet-to-dry, DSD. -Continue off-loading with surgical shoe bilaterally -Given that patient is a diabetic with last A1c of 14  subjective, patient is a high risk of losing digits versus possible amputation. I discussed this with patient extensive detail. Patient states understanding. -Doxycycline was dispensed for skin and soft tissue prophylaxis -I educated her in extensive detail about diabetes and all the complications and risk associated with it especially in the setting of uncontrolled diabetes and neuropathy as well as ulceration that are now present. I have encouraged her that local wound care will help heal the ulceration. However sugar control is best way to keep it from getting worse or allow it to heal properly. Patient states understanding.  Procedure: Excisional Debridement of Wound~all added together. Tool: Sharp chisel blade/tissue nipper Rationale: Removal of non-viable soft tissue from the wound to promote healing.  Anesthesia: none Pre-Debridement Wound Measurements: 1 cm x 1.3 cm x 0.1 cm Post-Debridement Wound Measurements: 1.1 cm x 1.3 cm x 0.1 cm Type of Debridement: Sharp Excisional Tissue Removed: Non-viable soft tissue Blood loss: Minimal (<50cc) Depth of Debridement: subcutaneous tissue. Technique: Sharp excisional debridement to bleeding, viable wound base.  Wound Progress: This is my initial evaluation we will continue monitor the progression of it. Site healing conversation 7 Dressing: Dry, sterile, compression dressing. Disposition: Patient tolerated procedure well. Patient to return in 1 week for follow-up.  No follow-ups on file.    No follow-ups on file.

## 2020-07-15 DIAGNOSIS — M79676 Pain in unspecified toe(s): Secondary | ICD-10-CM

## 2020-07-23 ENCOUNTER — Ambulatory Visit: Payer: Self-pay | Admitting: Podiatry

## 2020-07-24 DIAGNOSIS — M79676 Pain in unspecified toe(s): Secondary | ICD-10-CM

## 2020-07-28 ENCOUNTER — Other Ambulatory Visit: Payer: Self-pay

## 2020-07-28 ENCOUNTER — Ambulatory Visit (INDEPENDENT_AMBULATORY_CARE_PROVIDER_SITE_OTHER): Payer: Self-pay | Admitting: Podiatry

## 2020-07-28 ENCOUNTER — Encounter: Payer: Self-pay | Admitting: Podiatry

## 2020-07-28 DIAGNOSIS — E118 Type 2 diabetes mellitus with unspecified complications: Secondary | ICD-10-CM

## 2020-07-28 DIAGNOSIS — E1165 Type 2 diabetes mellitus with hyperglycemia: Secondary | ICD-10-CM

## 2020-07-28 DIAGNOSIS — IMO0002 Reserved for concepts with insufficient information to code with codable children: Secondary | ICD-10-CM

## 2020-07-28 DIAGNOSIS — L97512 Non-pressure chronic ulcer of other part of right foot with fat layer exposed: Secondary | ICD-10-CM

## 2020-07-28 DIAGNOSIS — L84 Corns and callosities: Secondary | ICD-10-CM

## 2020-07-29 ENCOUNTER — Encounter: Payer: Self-pay | Admitting: Podiatry

## 2020-07-29 NOTE — Progress Notes (Signed)
Subjective:  Patient ID: Destiny Luna, female    DOB: Jun 25, 1971,  MRN: [id number]  Chief Complaint  Patient presents with  . Foot Ulcer    "they are doing alot better"    49 y.o. female presents with the above complaint.  Patient presents with a follow-up of bilateral second third and fourth digit ulceration secondary to hammertoe contractures.  Patient is A1c is uncontrolled at last 1 being 14 however she states that has brought back has been brought down but she has not obtained a new A1c yet.  She states that she was doing local wound care and has been keeping it clean and dry.  She denies any other acute complaints.   Review of Systems: Negative except as noted in the HPI. Denies N/V/F/Ch.  Past Medical History:  Diagnosis Date  . Diabetes mellitus without complication (HCC)     Current Outpatient Medications:  .  amoxicillin (AMOXIL) 500 MG capsule, amoxicillin 500 mg capsule  TAKE 1 CAPSULE BY MOUTH EVERY 12 HOURS, Disp: , Rfl:  .  amoxicillin-clavulanate (AUGMENTIN) 875-125 MG tablet, Take 1 tablet by mouth 2 (two) times daily. X 7 days, Disp: 14 tablet, Rfl: 0 .  cephALEXin (KEFLEX) 500 MG capsule, cephalexin 500 mg capsule, Disp: , Rfl:  .  doxycycline (VIBRA-TABS) 100 MG tablet, Take 1 tablet (100 mg total) by mouth 2 (two) times daily., Disp: 20 tablet, Rfl: 0 .  fluticasone (FLONASE) 50 MCG/ACT nasal spray, Place 2 sprays into both nostrils daily., Disp: 16 g, Rfl: 2 .  ibuprofen (ADVIL) 600 MG tablet, Take 1 tablet (600 mg total) by mouth every 6 (six) hours as needed., Disp: 30 tablet, Rfl: 0 .  insulin glargine (LANTUS) 100 UNIT/ML injection, Inject 25 Units into the skin daily. 25-30 units, Disp: , Rfl:  .  levocetirizine (XYZAL) 5 MG tablet, Take 1 tablet (5 mg total) by mouth every evening., Disp: 30 tablet, Rfl: 0 .  Semaglutide (OZEMPIC, 1 MG/DOSE, [place]), Inject 1 each into the skin once a week., Disp: , Rfl:   Social History   Tobacco Use  Smoking Status  Current Every Day Smoker  . Packs/day: 0.25  . Years: 4.00  . Pack years: 1.00  . Types: Cigarettes  Smokeless Tobacco Never Used  Tobacco Comment   quit for 8 years and restarted    No Known Allergies Objective:  There were no vitals filed for this visit. There is no height or weight on file to calculate BMI. Constitutional Well developed. Well nourished.  Vascular Dorsalis pedis pulses palpable bilaterally. Posterior tibial pulses palpable bilaterally. Capillary refill normal to all digits.  No cyanosis or clubbing noted. Pedal hair growth normal.  Neurologic Normal speech. Oriented to person, place, and time. Epicritic sensation to light touch grossly present bilaterally.  Dermatologic Nails well groomed and normal in appearance. No open wounds. No skin lesions.  Orthopedic:  Bilateral 2nd, 3rd, 4th digit completely reepithelialized.  No clinical signs of infection noted.  No purulent drainage noted.  Does not probe down to bone.  Hyperkeratotic periwound noted. Hammertoe contracture semiflexible in nature noted to bilateral 2 through 5 digits.   Radiographs: None Assessment:   1. Diabetes mellitus type 2 with complications, uncontrolled (HCC)   2. Pre-ulcerative calluses   3. Right foot ulcer, with fat layer exposed (HCC)    Plan:  Patient was evaluated and treated and all questions answered.    Ulcer *Bilateral 2nd 3rd and 4th dorsal digit ulceration with fat layer exposed -Clinically  although dorsal digital ulcerations have healed.  Given the severe nature's of the hammertoe contractures of bilateral second third and fourth digit patient is a high risk of developing ulceration is again and therefore possible chance of osteomyelitis and therefore losing digital amputation.  Patient also last A1c was 14 patient states that her A1c has improved since then however she is waiting on getting a new A1c.  I discussed with her that eventually in the future we may need to  surgically correct the hammertoes once her A1c is below 8%.  At this time patient has wound complication rates are much higher and therefore we will hold off on any elective procedures for now.  I discussed this with the patient in extensive detail.  Patient states understanding and will plan on doing hammertoe surgery/correction of bilateral digits once her A1c is well controlled. -I discussed with patient shoe gear modification with wide toe box shoes.  Patient states understanding will obtain them. No follow-ups on file.    No follow-ups on file.

## 2020-08-04 ENCOUNTER — Encounter: Payer: Self-pay | Admitting: Podiatry

## 2020-09-03 ENCOUNTER — Ambulatory Visit: Payer: BC Managed Care – PPO | Admitting: Podiatry

## 2020-09-26 ENCOUNTER — Encounter: Payer: Self-pay | Admitting: Emergency Medicine

## 2020-09-26 ENCOUNTER — Other Ambulatory Visit: Payer: Self-pay

## 2020-09-26 ENCOUNTER — Ambulatory Visit
Admission: EM | Admit: 2020-09-26 | Discharge: 2020-09-26 | Disposition: A | Discharge status: Home / Self Care | Payer: HRSA Program | Attending: Physician Assistant | Admitting: Physician Assistant

## 2020-09-26 DIAGNOSIS — J069 Acute upper respiratory infection, unspecified: Secondary | ICD-10-CM

## 2020-09-26 DIAGNOSIS — Z20822 Contact with and (suspected) exposure to covid-19: Secondary | ICD-10-CM | POA: Diagnosis not present

## 2020-09-26 DIAGNOSIS — F17210 Nicotine dependence, cigarettes, uncomplicated: Secondary | ICD-10-CM | POA: Insufficient documentation

## 2020-09-26 DIAGNOSIS — Z8616 Personal history of COVID-19: Secondary | ICD-10-CM | POA: Diagnosis not present

## 2020-09-26 NOTE — Discharge Instructions (Addendum)
If your Covid test ends up positive you may take the following supplements to help your immune system be stronger to fight this viral infection Take Quarcetin 500 mg three times a day x 7 days with Zinc 50 mg ones a day x 7 days. The quarcetin is an antiviral and anti-inflammatory supplement which helps open the zinc channels in the cell to absorb Zinc. Zinc helps decrease the virus load in your body. Take Melatonin 6-10 mg at bed time which also helps support your immune system.  Also make sure to take Vit D 5,000 IU per day with a fatty meal and Vit C 1000 mg a day until you are completely better. Stay on Vitamin D 2,000  and C the rest of the season.  Don't lay around, keep active and walk as much as you are able to to prevent worsening of your symptoms.  Follow up with your family Dr next week.  If you get short of breath and you are able to check  your oxygen with a pulse oxygen meter, if it gets to 92% or less, you need to go to the hospital to be admitted. If you dont have one, come back here and we will assess you.   

## 2020-09-26 NOTE — ED Triage Notes (Signed)
Patient in today c/o cough x 2 days and headache x today. 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 Patient had covid in May 2021. Patient has been fully vaccinated against covid.

## 2020-09-26 NOTE — ED Provider Notes (Signed)
[facility]    CSN: [id number] Arrival date & time: 09/26/20  1448      History   Chief Complaint Chief Complaint  Patient presents with  . Headache  . Cough    HPI Destiny Luna is a 49 y.o. female who presents with onset of cough x 2 days. HA since today. She had covid in May 2021. Her cough is non productive. Had pain on her nose bridge and forehead. Denies fever, chills , sweats.  Appetite and energy have been well. 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  She has had Moderna covid injections and competed this 08/30/20     Past Medical History:  Diagnosis Date  . Diabetes mellitus without complication (HCC)     There are no problems to display for this patient.   Past Surgical History:  Procedure Laterality Date  . CESAREAN SECTION    . HERNIA REPAIR      OB History   No obstetric history on file.      Home Medications    Prior to Admission medications   Medication Sig Start Date End Date Taking? Authorizing Provider  insulin glargine (LANTUS) 100 UNIT/ML injection Inject 25 Units into the skin daily. 25-30 units   Yes [provider]  amoxicillin (AMOXIL) 500 MG capsule amoxicillin 500 mg capsule  TAKE 1 CAPSULE BY MOUTH EVERY 12 HOURS    [provider]  amoxicillin-clavulanate (AUGMENTIN) 875-125 MG tablet Take 1 tablet by mouth 2 (two) times daily. X 7 days 03/27/20   Domenick Gong, MD  cephALEXin (KEFLEX) 500 MG capsule cephalexin 500 mg capsule    [provider]  doxycycline (VIBRA-TABS) 100 MG tablet Take 1 tablet (100 mg total) by mouth 2 (two) times daily. 07/09/20   Candelaria Stagers, DPM  fluticasone (FLONASE) 50 MCG/ACT nasal spray Place 2 sprays into both nostrils daily. 03/27/20   Domenick Gong, MD  ibuprofen (ADVIL) 600 MG tablet Take 1 tablet (600 mg total) by mouth every 6 (six) hours as needed. 03/27/20   Domenick Gong, MD    levocetirizine (XYZAL) 5 MG tablet Take 1 tablet (5 mg total) by mouth every evening. 03/27/20 04/26/20  Domenick Gong, MD  Semaglutide (OZEMPIC, 1 MG/DOSE, [place]) Inject 1 each into the skin once a week.    [provider]    Family History Family History  Problem Relation Age of Onset  . Diabetes Mother   . CAD Father        CABG x 3  . Stroke Father   . Diabetes Father     Social History Social History   Tobacco Use  . Smoking status: Current Every Day Smoker    Packs/day: 0.25    Years: 4.00    Pack years: 1.00    Types: Cigarettes  . Smokeless tobacco: Never Used  . Tobacco comment: quit for 8 years and restarted  Vaping Use  . Vaping Use: Never used  Substance Use Topics  . Alcohol use: Yes    Comment: rarely  . Drug use: Never     Allergies   Patient has no known allergies.   Review of Systems Review of Systems  Constitutional: Negative for activity change, appetite change, chills, diaphoresis, fatigue and fever.  HENT: Positive for congestion and rhinorrhea. Negative for ear discharge, ear pain, postnasal drip, sore throat and trouble swallowing.   Eyes: Negative for discharge.  Respiratory: Positive  for cough. Negative for chest tightness and shortness of breath.   Cardiovascular: Negative for chest pain.  Gastrointestinal: Negative for diarrhea and vomiting.  Musculoskeletal: Negative for myalgias.  Skin: Negative for rash.  Neurological: Positive for headaches.  Hematological: Negative for adenopathy.     Physical Exam Triage Vital Signs ED Triage Vitals  Enc Vitals Group     BP 09/26/20 1501 (!) 147/92     Pulse Rate 09/26/20 1501 (!) 101     Resp 09/26/20 1501 18     Temp 09/26/20 1501 98.2 F (36.8 C)     Temp Source 09/26/20 1501 Oral     SpO2 09/26/20 1501 99 %     Weight 09/26/20 1501 260 lb (117.9 kg)     Height 09/26/20 1501 5' 5.5" (1.664 m)     Head Circumference --      Peak Flow --      Pain Score 09/26/20 1500 7      Pain Loc --      Pain Edu? --      Excl. in GC? --    No data found.  Updated Vital Signs BP (!) 147/92 (BP Location: Left Arm)   Pulse (!) 101   Temp 98.2 F (36.8 C) (Oral)   Resp 18   Ht 5' 5.5" (1.664 m)   Wt 260 lb (117.9 kg)   LMP 09/24/2020 (Exact Date)   SpO2 99%   BMI 42.61 kg/m   Visual Acuity Right Eye Distance:   Left Eye Distance:   Bilateral Distance:    Right Eye Near:   Left Eye Near:    Bilateral Near:     Physical Exam Physical Exam Vitals signs and nursing note reviewed.  Constitutional:      General: She is not in acute distress.    Appearance: Normal appearance. She is not ill-appearing, toxic-appearing or diaphoretic.  HENT:     Head: Normocephalic.     Right Ear: Tympanic membrane, ear canal and external ear normal.     Left Ear: Tympanic membrane, ear canal and external ear normal.     Nose: Nose normal.     Mouth/Throat:     Mouth: Mucous membranes are moist.  Eyes:     General: No scleral icterus.       Right eye: No discharge.        Left eye: No discharge.     Conjunctiva/sclera: Conjunctivae normal.  Neck:     Musculoskeletal: Neck supple. No neck rigidity.  Cardiovascular:     Rate and Rhythm: Normal rate and regular rhythm.     Heart sounds: No murmur.  Pulmonary:     Effort: Pulmonary effort is normal.     Breath sounds: Normal breath sounds.   Musculoskeletal: Normal range of motion.  Lymphadenopathy:     Cervical: No cervical adenopathy.  Skin:    General: Skin is warm and dry.     Coloration: Skin is not jaundiced.     Findings: No rash.  Neurological:     Mental Status: She is alert and oriented to person, place, and time.     Gait: Gait normal.  Psychiatric:        Mood and Affect: Mood normal.        Behavior: Behavior normal.        Thought Content: Thought content normal.        Judgment: Judgment normal.     UC Treatments / Results  Labs (all labs  ordered are listed, but only abnormal results are  displayed) Labs Reviewed  SARS CORONAVIRUS 2 (TAT 6-24 HRS)    EKG   Radiology No results found.  Procedures Procedures (including critical care time)  Medications Ordered in UC Medications - No data to display  Initial Impression / Assessment and Plan / UC Course  I have reviewed the triage vital signs and the nursing notes. Covid test is pending and she will check on the results via Mychart. See instructions.    Final Clinical Impressions(s) / UC Diagnoses   Final diagnoses:  None   Discharge Instructions   None    ED Prescriptions    None     PDMP not reviewed this encounter.   Garey Ham, PA-C 09/26/20 1534

## 2020-09-27 LAB — SARS CORONAVIRUS 2 (TAT 6-24 HRS): SARS Coronavirus 2: NEGATIVE

## 2020-10-27 ENCOUNTER — Ambulatory Visit (INDEPENDENT_AMBULATORY_CARE_PROVIDER_SITE_OTHER): Payer: Self-pay | Admitting: Podiatry

## 2020-10-27 ENCOUNTER — Other Ambulatory Visit: Payer: Self-pay

## 2020-10-27 ENCOUNTER — Encounter: Payer: Self-pay | Admitting: Podiatry

## 2020-10-27 DIAGNOSIS — E1165 Type 2 diabetes mellitus with hyperglycemia: Secondary | ICD-10-CM

## 2020-10-27 DIAGNOSIS — Z7189 Other specified counseling: Secondary | ICD-10-CM

## 2020-10-27 DIAGNOSIS — IMO0002 Reserved for concepts with insufficient information to code with codable children: Secondary | ICD-10-CM

## 2020-10-27 DIAGNOSIS — E118 Type 2 diabetes mellitus with unspecified complications: Secondary | ICD-10-CM

## 2020-10-27 NOTE — Progress Notes (Signed)
Subjective:  Patient ID: Destiny Luna, female    DOB: Apr 08, 1971,  MRN: [id number]  Chief Complaint  Patient presents with  . Callouses    "Im doing great"    49 y.o. female presents with the above complaint.  Patient presents with follow-up of bilateral second third and fourth digit hammertoe contractures.  She states her A1c and her sugars have been better however she is waiting for the next result which may be next month.  She has been doing better in terms of diet control.  She denies any other acute complaints.  She states that her nail did fall off both of the hallux last few weeks otherwise no acute complaints   Review of Systems: Negative except as noted in the HPI. Denies N/V/F/Ch.  Past Medical History:  Diagnosis Date  . Diabetes mellitus without complication (HCC)     Current Outpatient Medications:  .  insulin glargine (LANTUS) 100 UNIT/ML injection, Inject 25 Units into the skin daily. 25-30 units, Disp: , Rfl:  .  NOVOLIN N FLEXPEN RELION 100 UNIT/ML [place], Inject into the skin., Disp: , Rfl:   Social History   Tobacco Use  Smoking Status Current Every Day Smoker  . Packs/day: 0.25  . Years: 4.00  . Pack years: 1.00  . Types: Cigarettes  Smokeless Tobacco Never Used  Tobacco Comment   quit for 8 years and restarted    No Known Allergies Objective:  There were no vitals filed for this visit. There is no height or weight on file to calculate BMI. Constitutional Well developed. Well nourished.  Vascular Dorsalis pedis pulses palpable bilaterally. Posterior tibial pulses palpable bilaterally. Capillary refill normal to all digits.  No cyanosis or clubbing noted. Pedal hair growth normal.  Neurologic Normal speech. Oriented to person, place, and time. Epicritic sensation to light touch grossly present bilaterally.  Dermatologic Nails well groomed and normal in appearance. No open wounds. No skin lesions.  Orthopedic:  Bilateral 2nd, 3rd, 4th digit  completely reepithelialized.  No clinical signs of infection noted.  No purulent drainage noted.  Does not probe down to bone.  Hyperkeratotic periwound noted. Hammertoe contracture semiflexible in nature noted to bilateral 2 through 5 digits.   Radiographs: None Assessment:   1. Diabetes mellitus type 2 with complications, uncontrolled (HCC)   2. Encounter for diabetes education    Plan:  Patient was evaluated and treated and all questions answered.  Bilateral 2nd 3rd and 4th dorsal digit with underlying hammertoe contracture -Clinically although dorsal digital ulcerations have healed.  Given the severe nature's of the hammertoe contractures of bilateral second third and fourth digit patient is a high risk of developing ulceration is again and therefore possible chance of osteomyelitis and therefore losing digital amputation.  Patient also last A1c was 14 patient states that her A1c has improved since then however she is waiting on getting a new A1c.  I discussed with her that eventually in the future we may need to surgically correct the hammertoes once her A1c is below 8%.  At this time patient has wound complication rates are much higher and therefore we will hold off on any elective procedures for now.  I discussed this with the patient in extensive detail.  Patient states understanding and will plan on doing hammertoe surgery/correction of bilateral digits once her A1c is well controlled. -I discussed with patient shoe gear modification with wide toe box shoes.  Patient states understanding will obtain them. No follow-ups on file.  No follow-ups on file.

## 2021-01-28 ENCOUNTER — Ambulatory Visit (INDEPENDENT_AMBULATORY_CARE_PROVIDER_SITE_OTHER): Payer: BC Managed Care – PPO | Admitting: Podiatry

## 2021-01-28 ENCOUNTER — Encounter: Payer: Self-pay | Admitting: Podiatry

## 2021-01-28 ENCOUNTER — Other Ambulatory Visit: Payer: Self-pay

## 2021-01-28 ENCOUNTER — Ambulatory Visit: Payer: Self-pay | Admitting: Podiatry

## 2021-01-28 DIAGNOSIS — R601 Generalized edema: Secondary | ICD-10-CM | POA: Diagnosis not present

## 2021-01-28 NOTE — Progress Notes (Signed)
Subjective:  Patient ID: Destiny Luna, female    DOB: [birth year],  MRN: [id number]  Chief Complaint  Patient presents with  . Diabetes    DFC,  She says she has had some swelling in her right foot that comes and goes, but denies any pain   No other complaints    50 y.o. female presents with the above complaint.  Patient presents with complaint of right ankle swelling to the foot.  Patient states it comes and goes.  Patient experiences more dependent at edema when she is on her foot she notices a swelling but when she wakes up in the morning after being rested she does not notice the swelling.  She states is being on for quite some time.  She occasionally wears compression socks but not very compliantly.  She denies any other acute complaints.  There is no pain associated with it but just wanted to get it evaluated.  She is a diabetic with uncontrolled A1c she does not know her A1c yet.  But she states is going down.   Review of Systems: Negative except as noted in the HPI. Denies N/V/F/Ch.  Past Medical History:  Diagnosis Date  . Diabetes mellitus without complication (HCC)     Current Outpatient Medications:  .  insulin glargine (LANTUS) 100 UNIT/ML injection, Inject 25 Units into the skin daily. 25-30 units, Disp: , Rfl:  .  NOVOLIN N FLEXPEN RELION 100 UNIT/ML [place]wk[place], Inject into the skin., Disp: , Rfl:   Social History   Tobacco Use  Smoking Status Current Every Day Smoker  . Packs/day: 0.25  . Years: 4.00  . Pack years: 1.00  . Types: Cigarettes  Smokeless Tobacco Never Used  Tobacco Comment   quit for 8 years and restarted    No Known Allergies Objective:  There were no vitals filed for this visit. There is no height or weight on file to calculate BMI. Constitutional Well developed. Well nourished.  Vascular Dorsalis pedis pulses palpable bilaterally. Posterior tibial pulses palpable bilaterally. Capillary refill normal to all digits.  No cyanosis or clubbing  noted. Pedal hair growth normal.  Neurologic Normal speech. Oriented to person, place, and time. Epicritic sensation to light touch grossly present bilaterally.  Dermatologic Nails well groomed and normal in appearance. No open wounds. No skin lesions.  Orthopedic:  Bilateral 2nd, 3rd, 4th digit completely reepithelialized.  No clinical signs of infection noted.  No purulent drainage noted.  Does not probe down to bone.  Hyperkeratotic periwound noted. Hammertoe contracture semiflexible in nature noted to bilateral 2 through 5 digits.  Generalized edema noted to the right ankle 1+ pitting edema.  No pain on palpation with range of motion of the ankle joint as well as ATFL ligament Achilles tendon peroneal tendon posterior tibial tendon.   Radiographs: None Assessment:   1. Generalized edema    Plan:  Patient was evaluated and treated and all questions answered.  Right ankle generalized edema -I explained the patient the etiology of ankle edema especially when being dependent on the foot while working and various treatment options were discussed.  I believe patient will benefit from compression socks.  I discussed with her to obtain 18 to 20 mmHg pressure compression socks from nearby store to help with the compression.  Patient states understanding and she will do that immediately.  Also discussed with her about I ice elevation of the extremity to help with the swelling.  She states understanding will do so. .  Bilateral 2nd  3rd and 4th dorsal digit with underlying hammertoe contracture -Clinically although dorsal digital ulcerations have healed.  Given the severe nature's of the hammertoe contractures of bilateral second third and fourth digit patient is a high risk of developing ulceration is again and therefore possible chance of osteomyelitis and therefore losing digital amputation.  Patient also last A1c was 14 patient states that her A1c has improved since then however she is waiting  on getting a new A1c.  I discussed with her that eventually in the future we may need to surgically correct the hammertoes once her A1c is below 8%.  At this time patient has wound complication rates are much higher and therefore we will hold off on any elective procedures for now.  I discussed this with the patient in extensive detail.  Patient states understanding and will plan on doing hammertoe surgery/correction of bilateral digits once her A1c is well controlled. -I discussed with patient shoe gear modification with wide toe box shoes.  Patient states understanding will obtain them. No follow-ups on file.    No follow-ups on file.

## 2021-01-31 ENCOUNTER — Emergency Department: Payer: BC Managed Care – PPO

## 2021-01-31 ENCOUNTER — Other Ambulatory Visit: Payer: Self-pay

## 2021-01-31 ENCOUNTER — Encounter: Payer: Self-pay | Admitting: Emergency Medicine

## 2021-01-31 ENCOUNTER — Emergency Department
Admission: EM | Admit: 2021-01-31 | Discharge: 2021-01-31 | Disposition: A | Discharge status: Home / Self Care | Payer: BC Managed Care – PPO | Attending: Emergency Medicine | Admitting: Emergency Medicine

## 2021-01-31 DIAGNOSIS — Y9289 Other specified places as the place of occurrence of the external cause: Secondary | ICD-10-CM | POA: Diagnosis not present

## 2021-01-31 DIAGNOSIS — Y30XXXA Falling, jumping or pushed from a high place, undetermined intent, initial encounter: Secondary | ICD-10-CM | POA: Insufficient documentation

## 2021-01-31 DIAGNOSIS — Z794 Long term (current) use of insulin: Secondary | ICD-10-CM | POA: Diagnosis not present

## 2021-01-31 DIAGNOSIS — M79605 Pain in left leg: Secondary | ICD-10-CM | POA: Diagnosis not present

## 2021-01-31 DIAGNOSIS — F17210 Nicotine dependence, cigarettes, uncomplicated: Secondary | ICD-10-CM | POA: Diagnosis not present

## 2021-01-31 DIAGNOSIS — E119 Type 2 diabetes mellitus without complications: Secondary | ICD-10-CM | POA: Insufficient documentation

## 2021-01-31 DIAGNOSIS — Y9333 Activity, BASE jumping: Secondary | ICD-10-CM | POA: Diagnosis not present

## 2021-01-31 NOTE — ED Provider Notes (Signed)
[facility] Emergency Department Provider Note  ____________________________________________   Event Date/Time   First MD Initiated Contact with Patient 01/31/21 1716     (approximate)  I have reviewed the triage vital signs and the nursing notes.   HISTORY  Chief Complaint Leg Injury    HPI Destiny Luna is a 50 y.o. female presents emergency department complaining of left leg pain.  Patient states she jumped off a small ledge and when she landed she heard a loud pop.  No numbness or tingling.  No bruising on the leg.  No loss of motion but it is painful to bear weight.    Past Medical History:  Diagnosis Date  . Diabetes mellitus without complication (HCC)     There are no problems to display for this patient.   Past Surgical History:  Procedure Laterality Date  . CESAREAN SECTION    . HERNIA REPAIR      Prior to Admission medications   Medication Sig Start Date End Date Taking? Authorizing Provider  insulin glargine (LANTUS) 100 UNIT/ML injection Inject 25 Units into the skin daily. 25-30 units    [provider]  NOVOLIN N FLEXPEN RELION 100 UNIT/ML Kiwkpen Inject into the skin. 09/15/20   [provider]  fluticasone (FLONASE) 50 MCG/ACT nasal spray Place 2 sprays into both nostrils daily. 03/27/20 09/26/20  Domenick Gong, MD  levocetirizine (XYZAL) 5 MG tablet Take 1 tablet (5 mg total) by mouth every evening. 03/27/20 09/26/20  Domenick Gong, MD    Allergies Patient has no known allergies.  Family History  Problem Relation Age of Onset  . Diabetes Mother   . CAD Father        CABG x 3  . Stroke Father   . Diabetes Father     Social History Social History   Tobacco Use  . Smoking status: Current Every Day Smoker    Packs/day: 0.25    Years: 4.00    Pack years: 1.00    Types: Cigarettes  . Smokeless tobacco: Never Used  . Tobacco comment: quit for 8 years and restarted  Vaping Use  . Vaping  Use: Never used  Substance Use Topics  . Alcohol use: Yes    Comment: rarely  . Drug use: Never    Review of Systems  Constitutional: No fever/chills Eyes: No visual changes. ENT: No sore throat. Respiratory: Denies cough Cardiovascular: Denies chest pain Gastrointestinal: Denies abdominal pain Genitourinary: Negative for dysuria. Musculoskeletal: Negative for back pain.  Positive for left leg pain Skin: Negative for rash. Psychiatric: no mood changes,     ____________________________________________   PHYSICAL EXAM:  VITAL SIGNS: ED Triage Vitals  Enc Vitals Group     BP 01/31/21 1649 133/83     Pulse Rate 01/31/21 1649 (!) 103     Resp 01/31/21 1649 18     Temp 01/31/21 1649 99 F (37.2 C)     Temp Source 01/31/21 1649 Oral     SpO2 01/31/21 1649 98 %     Weight 01/31/21 1649 260 lb (117.9 kg)     Height 01/31/21 1649 5' 5.5" (1.664 m)     Head Circumference --      Peak Flow --      Pain Score 01/31/21 1655 7     Pain Loc --      Pain Edu? --      Excl. in GC? --     Constitutional: Alert and oriented. Well appearing and  in no acute distress. Eyes: Conjunctivae are normal.  Head: Atraumatic. Nose: No congestion/rhinnorhea. Mouth/Throat: Mucous membranes are moist.   Neck:  supple no lymphadenopathy noted Cardiovascular: Normal rate, regular rhythm.  Respiratory: Normal respiratory effort.  No retractions, GU: deferred Musculoskeletal: FROM all extremities, warm and well perfused, gastrocnemius muscle is tender to palpation, Achilles is intact, no bruising is noted posteriorly, neurovascular is intact, patient has full range of motion of ankle and knee Neurologic:  Normal speech and language.  Skin:  Skin is warm, dry and intact. No rash noted. Psychiatric: Mood and affect are normal. Speech and behavior are normal.  ____________________________________________   LABS (all labs ordered are listed, but only abnormal results are displayed)  Labs  Reviewed - No data to display ____________________________________________   ____________________________________________  RADIOLOGY  X-ray of the left tib-fib  ____________________________________________   PROCEDURES  Procedure(s) performed: No  Procedures    ____________________________________________   INITIAL IMPRESSION / ASSESSMENT AND PLAN / ED COURSE  Pertinent labs & imaging results that were available during my care of the patient were reviewed by me and considered in my medical decision making (see chart for details).   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  See HPI.  Physical exam shows patient be tender along the posterior part of the left leg.  X-ray of the left tib-fib   X-ray of the left tib-fib reviewed by me confirmed by radiology to be negative.  I explained findings to the patient.  Ace wrap's were applied as CAM Walker did not help.  She is to elevate and ice.  Follow-up with orthopedics.  Return emergency department worsening.  She is discharged stable condition.  Destiny Luna was evaluated in Emergency Department on 01/31/2021 for the symptoms described in the history of present illness. She was evaluated in the context of the global COVID-19 pandemic, which necessitated consideration that the patient might be at risk for infection with the SARS-CoV-2 virus that causes COVID-19. Institutional protocols and algorithms that pertain to the evaluation of patients at risk for COVID-19 are in a state of rapid change based on information released by regulatory bodies including the CDC and federal and state organizations. These policies and algorithms were followed during the patient's care in the ED.    As part of my medical decision making, I reviewed the following data within the electronic MEDICAL RECORD NUMBER Nursing notes reviewed and incorporated, Old chart reviewed, Radiograph reviewed , Notes  from prior ED visits and [place] Controlled Substance Database  ____________________________________________   FINAL CLINICAL IMPRESSION(S) / ED DIAGNOSES  Final diagnoses:  Acute leg pain, left      NEW MEDICATIONS STARTED DURING THIS VISIT:  New Prescriptions   No medications on file     Note:  This document was prepared using Dragon voice recognition software and may include unintentional dictation errors.    Faythe Ghee, PA-C 01/31/21 Myra Gianotti, MD 01/31/21 Ebony Cargo

## 2021-01-31 NOTE — Discharge Instructions (Addendum)
Apply ice to the lower leg, elevate the lower leg, wear support hose, follow-up with orthopedic

## 2021-01-31 NOTE — ED Triage Notes (Signed)
Pt present to ED for L leg injury. Per pt, pt jumped off a small ledge and when she landed she heard a pop. Since then she has been having pain in her L calf. Pt is ambulatory on the L leg. Pt is A&Ox4 and NAD. Incident happened around 1:30pm this afternoon.

## 2021-02-05 DIAGNOSIS — M79676 Pain in unspecified toe(s): Secondary | ICD-10-CM

## 2021-05-02 ENCOUNTER — Other Ambulatory Visit: Payer: Self-pay

## 2021-05-02 ENCOUNTER — Ambulatory Visit
Admission: EM | Admit: 2021-05-02 | Discharge: 2021-05-02 | Disposition: A | Discharge status: Home / Self Care | Payer: BC Managed Care – PPO | Attending: Family Medicine | Admitting: Family Medicine

## 2021-05-02 ENCOUNTER — Encounter: Payer: Self-pay | Admitting: Emergency Medicine

## 2021-05-02 DIAGNOSIS — H00011 Hordeolum externum right upper eyelid: Secondary | ICD-10-CM | POA: Diagnosis not present

## 2021-05-02 DIAGNOSIS — R21 Rash and other nonspecific skin eruption: Secondary | ICD-10-CM | POA: Diagnosis not present

## 2021-05-02 MED ORDER — CLOTRIMAZOLE-BETAMETHASONE 1-0.05 % EX CREA
TOPICAL_CREAM | CUTANEOUS | 0 refills | Status: DC
Start: 2021-05-02 — End: 2023-05-01

## 2021-05-02 MED ORDER — MOXIFLOXACIN HCL 0.5 % OP SOLN
1.0000 [drp] | Freq: Three times a day (TID) | OPHTHALMIC | 0 refills | Status: AC
Start: 2021-05-02 — End: 2021-05-09

## 2021-05-02 NOTE — Discharge Instructions (Signed)
Medication as prescribed.  Warm compresses to the eye.  Take care  Dr. Adriana Simas

## 2021-05-02 NOTE — ED Triage Notes (Signed)
Patient c/o swelling redness in her right upper eye lid that started this morning.  Patient also reports an itchy rash on her right lower leg that started 2 days ago.

## 2021-05-02 NOTE — ED Provider Notes (Signed)
[facility]    CSN: [id number] Arrival date & time: 05/02/21  1050      History   Chief Complaint Chief Complaint  Patient presents with  . Eye Problem    right  . Rash   HPI   50 year old female presents with the above complaints.  Patient reports that she has had a rash on her lower extremities for the past several days.  It is raised and itchy.  Seems to be spreading.  Patient reports that she has now developed right upper eyelid swelling as well.  She is unsure of these 2 things are related.  She reports eye crusting and discharge this morning.  No relieving factors.  No other complaints at this time.  Past Medical History:  Diagnosis Date  . Diabetes mellitus without complication [facility])    Past Surgical History:  Procedure Laterality Date  . CESAREAN SECTION    . HERNIA REPAIR     OB History   No obstetric history on file.     Home Medications    Prior to Admission medications   Medication Sig Start Date End Date Taking? Authorizing Provider  clotrimazole-betamethasone (LOTRISONE) cream Apply to affected area 2 times daily. 05/02/21  Yes Katheline Brendlinger G, DO  moxifloxacin (VIGAMOX) 0.5 % ophthalmic solution Place 1 drop into the right eye 3 (three) times daily for 7 days. 05/02/21 05/09/21 Yes Icess Bertoni G, DO  NOVOLIN N FLEXPEN RELION 100 UNIT/ML Kiwkpen Inject into the skin. 09/15/20  Yes [provider]  fluticasone (FLONASE) 50 MCG/ACT nasal spray Place 2 sprays into both nostrils daily. 03/27/20 09/26/20  Domenick Gong, MD  insulin glargine (LANTUS) 100 UNIT/ML injection Inject 25 Units into the skin daily. 25-30 units  05/02/21  [provider]  levocetirizine (XYZAL) 5 MG tablet Take 1 tablet (5 mg total) by mouth every evening. 03/27/20 09/26/20  Domenick Gong, MD    Family History Family History  Problem Relation Age of Onset  . Diabetes Mother   . CAD Father        CABG x 3  . Stroke Father   . Diabetes Father      Social History Social History   Tobacco Use  . Smoking status: Current Every Day Smoker    Packs/day: 0.25    Years: 4.00    Pack years: 1.00    Types: Cigarettes  . Smokeless tobacco: Never Used  . Tobacco comment: quit for 8 years and restarted  Vaping Use  . Vaping Use: Never used  Substance Use Topics  . Alcohol use: Yes    Comment: rarely  . Drug use: Never     Allergies   Patient has no known allergies.   Review of Systems Review of Systems Per HPI  Physical Exam Triage Vital Signs ED Triage Vitals  Enc Vitals Group     BP 05/02/21 1132 (!) 155/99     Pulse Rate 05/02/21 1132 93     Resp 05/02/21 1132 15     Temp 05/02/21 1132 98.6 F (37 C)     Temp Source 05/02/21 1132 Oral     SpO2 05/02/21 1132 100 %     Weight 05/02/21 1130 255 lb (115.7 kg)     Height 05/02/21 1130 5\' 5"  (1.651 m)     Head Circumference --      Peak Flow --      Pain Score 05/02/21 1130 2     Pain Loc --  Pain Edu? --      Excl. in GC? --     Updated Vital Signs BP (!) 155/99 (BP Location: Right Arm)   Pulse 93   Temp 98.6 F (37 C) (Oral)   Resp 15   Ht 5\' 5"  (1.651 m)   Wt 115.7 kg   SpO2 100%   BMI 42.43 kg/m   Visual Acuity Right Eye Distance:   Left Eye Distance:   Bilateral Distance:    Right Eye Near:   Left Eye Near:    Bilateral Near:     Physical Exam Vitals and nursing note reviewed.  Constitutional:      General: She is not in acute distress.    Appearance: Normal appearance. She is not ill-appearing.  HENT:     Head: Normocephalic and atraumatic.  Eyes:     General:        Right eye: No discharge.        Left eye: No discharge.     Conjunctiva/sclera: Conjunctivae normal.     Comments: Right upper eyelid edema.  Pulmonary:     Effort: Pulmonary effort is normal. No respiratory distress.  Skin:    Comments: Scattered raised, circular erythematous areas on the lower extremities bilaterally.   Neurological:     Mental Status: She  is alert.  Psychiatric:        Mood and Affect: Mood normal.        Behavior: Behavior normal.    UC Treatments / Results  Labs (all labs ordered are listed, but only abnormal results are displayed) Labs Reviewed - No data to display  EKG   Radiology No results found.  Procedures Procedures (including critical care time)  Medications Ordered in UC Medications - No data to display  Initial Impression / Assessment and Plan / UC Course  I have reviewed the triage vital signs and the nursing notes.  Pertinent labs & imaging results that were available during my care of the patient were reviewed by me and considered in my medical decision making (see chart for details).    50 year old female presents with 2 separate issues.  Patient has a stye of the right upper eyelid.  Has had discharge and crusting as well.  Treating with warm compresses and Vigamox.  Patient also has a rash on her lower extremities.  Erythematous and itchy.  Some areas have an appearance consistent with tinea.  Placing on empiric Lotrisone.  Final Clinical Impressions(s) / UC Diagnoses   Final diagnoses:  Hordeolum externum of right upper eyelid  Rash     Discharge Instructions     Medication as prescribed.  Warm compresses to the eye.  Take care  Dr. 54    ED Prescriptions    Medication Sig Dispense Auth. Provider   moxifloxacin (VIGAMOX) 0.5 % ophthalmic solution Place 1 drop into the right eye 3 (three) times daily for 7 days. 3 mL Nakaya Mishkin G, DO   clotrimazole-betamethasone (LOTRISONE) cream Apply to affected area 2 times daily. 45 g [birth year]1984, DO     PDMP not reviewed this encounter.   Tommie Sams, Tommie Sams 05/02/21 1443

## 2021-06-03 ENCOUNTER — Other Ambulatory Visit: Payer: Self-pay

## 2021-06-03 ENCOUNTER — Ambulatory Visit
Admission: RE | Admit: 2021-06-03 | Discharge: 2021-06-03 | Disposition: A | Discharge status: Home / Self Care | Payer: BC Managed Care – PPO | Source: Ambulatory Visit | Attending: Sports Medicine | Admitting: Sports Medicine

## 2021-06-03 VITALS — BP 145/99 | HR 98 | Temp 99.2°F | Resp 16

## 2021-06-03 DIAGNOSIS — R8290 Unspecified abnormal findings in urine: Secondary | ICD-10-CM

## 2021-06-03 DIAGNOSIS — R3915 Urgency of urination: Secondary | ICD-10-CM

## 2021-06-03 DIAGNOSIS — N3000 Acute cystitis without hematuria: Secondary | ICD-10-CM | POA: Insufficient documentation

## 2021-06-03 DIAGNOSIS — R350 Frequency of micturition: Secondary | ICD-10-CM | POA: Diagnosis present

## 2021-06-03 LAB — POCT URINALYSIS DIP (DEVICE)
Bilirubin Urine: NEGATIVE
Glucose, UA: NEGATIVE mg/dL
Ketones, ur: NEGATIVE mg/dL
Leukocytes,Ua: NEGATIVE
Nitrite: POSITIVE — AB
Protein, ur: >=300 mg/dL — AB
Specific Gravity, Urine: >=1.03 (ref 1.005–1.030)
Urobilinogen, UA: 0.2 mg/dL (ref 0.0–1.0)
pH: 6.5 (ref 5.0–8.0)

## 2021-06-03 MED ORDER — NITROFURANTOIN MONOHYD MACRO 100 MG PO CAPS
100.0000 mg | ORAL_CAPSULE | Freq: Two times a day (BID) | ORAL | 0 refills | Status: DC
Start: 2021-06-03 — End: 2022-05-13

## 2021-06-03 NOTE — Discharge Instructions (Addendum)
As we discussed, your urine is not overwhelmingly positive but you do have some byproducts of bacterial breakdown and I am going to go ahead and treat you for a UTI.  I sent off the culture.  Someone may contact you and tell you that they need to change the antibiotic or they may contact you and tell you to stop the antibiotic. Please see educational handouts. Please flush your system with plenty of water.  You can also use cranberry juice or cranberry pills. If you develop any fevers or back pain it could be a sign that you are developing a kidney infection which you should go to the ER for.

## 2021-06-03 NOTE — ED Provider Notes (Signed)
[facility]    CSN: [id number] Arrival date & time: 06/03/21  1844      History   Chief Complaint Chief Complaint  Patient presents with   Urinary Frequency    HPI Destiny Luna is a 50 y.o. female.   Patient is a 50 year old female who presents for evaluation of possible UTI.  She does not have a primary care provider.  She does have an endocrinologist down in [place] for her type 2 diabetes.  She works as a Financial controller for [employer].  She reports 2 days of cloudy foul-smelling urine.  She denies any significant dysuria.  She says she does have diabetes and when she gets a UTI these are her symptoms.  She does not really have any dysuria or hematuria.  She does have increased urinary frequency and urgency.  No significant abdominal pain or flank pain.  No nausea vomiting or diarrhea.  No fever shakes chills.  No chest pain or shortness of breath.  No red flag signs or symptoms were elicited on history.   Past Medical History:  Diagnosis Date   Diabetes mellitus without complication (HCC)     There are no problems to display for this patient.   Past Surgical History:  Procedure Laterality Date   CESAREAN SECTION     HERNIA REPAIR      OB History   No obstetric history on file.      Home Medications    Prior to Admission medications   Medication Sig Start Date End Date Taking? Authorizing Provider  nitrofurantoin, macrocrystal-monohydrate, (MACROBID) 100 MG capsule Take 1 capsule (100 mg total) by mouth 2 (two) times daily. 06/03/21  Yes Delton See, MD  NOVOLIN N FLEXPEN RELION 100 UNIT/ML Kiwkpen Inject into the skin. 09/15/20  Yes [provider]  clotrimazole-betamethasone (LOTRISONE) cream Apply to affected area 2 times daily. 05/02/21   Tommie Sams, DO  fluticasone (FLONASE) 50 MCG/ACT nasal spray Place 2 sprays into both nostrils daily. 03/27/20 09/26/20  Domenick Gong, MD  insulin glargine (LANTUS) 100 UNIT/ML  injection Inject 25 Units into the skin daily. 25-30 units  05/02/21  [provider]  levocetirizine (XYZAL) 5 MG tablet Take 1 tablet (5 mg total) by mouth every evening. 03/27/20 09/26/20  Domenick Gong, MD    Family History Family History  Problem Relation Age of Onset   Diabetes Mother    CAD Father        CABG x 3   Stroke Father    Diabetes Father     Social History Social History   Tobacco Use   Smoking status: Every Day    Packs/day: 0.25    Years: 4.00    Pack years: 1.00    Types: Cigarettes   Smokeless tobacco: Never   Tobacco comments:    quit for 8 years and restarted  Vaping Use   Vaping Use: Never used  Substance Use Topics   Alcohol use: Yes    Comment: rarely   Drug use: Never     Allergies   Patient has no known allergies.   Review of Systems Review of Systems  Constitutional:  Negative for activity change, appetite change, chills, diaphoresis, fatigue and fever.  HENT:  Negative for congestion, ear pain, postnasal drip, rhinorrhea, sinus pressure, sinus pain, sneezing and sore throat.   Eyes:  Negative for pain.  Respiratory:  Negative for cough, chest tightness and shortness of breath.   Cardiovascular:  Negative for chest pain  and palpitations.  Gastrointestinal:  Negative for abdominal pain, diarrhea, nausea and vomiting.  Genitourinary:  Positive for frequency and urgency. Negative for dysuria, hematuria, vaginal bleeding, vaginal discharge and vaginal pain.  Musculoskeletal:  Negative for back pain, myalgias and neck pain.  Skin:  Negative for color change, pallor, rash and wound.  Neurological:  Negative for dizziness, light-headedness and headaches.  All other systems reviewed and are negative.   Physical Exam Triage Vital Signs ED Triage Vitals  Enc Vitals Group     BP 06/03/21 1858 (!) 145/99     Pulse Rate 06/03/21 1854 98     Resp 06/03/21 1854 16     Temp 06/03/21 1854 99.2 F (37.3 C)     Temp Source 06/03/21  1854 Oral     SpO2 06/03/21 1854 100 %     Weight --      Height --      Head Circumference --      Peak Flow --      Pain Score 06/03/21 1853 0     Pain Loc --      Pain Edu? --      Excl. in GC? --    No data found.  Updated Vital Signs BP (!) 145/99   Pulse 98   Temp 99.2 F (37.3 C) (Oral)   Resp 16   SpO2 100%   Visual Acuity Right Eye Distance:   Left Eye Distance:   Bilateral Distance:    Right Eye Near:   Left Eye Near:    Bilateral Near:     Physical Exam Vitals and nursing note reviewed.  Constitutional:      General: She is not in acute distress.    Appearance: Normal appearance. She is not ill-appearing, toxic-appearing or diaphoretic.  HENT:     Head: Normocephalic and atraumatic.     Nose: Nose normal.     Mouth/Throat:     Mouth: Mucous membranes are moist.  Eyes:     General: No scleral icterus.       Right eye: No discharge.        Left eye: No discharge.     Extraocular Movements: Extraocular movements intact.     Conjunctiva/sclera: Conjunctivae normal.     Pupils: Pupils are equal, round, and reactive to light.  Cardiovascular:     Rate and Rhythm: Normal rate and regular rhythm.     Pulses: Normal pulses.     Heart sounds: Normal heart sounds. No murmur heard.   No friction rub. No gallop.  Pulmonary:     Effort: Pulmonary effort is normal.     Breath sounds: Normal breath sounds. No stridor. No wheezing, rhonchi or rales.  Abdominal:     General: There is no distension.     Palpations: Abdomen is soft.     Tenderness: There is no abdominal tenderness. There is no right CVA tenderness, left CVA tenderness, guarding or rebound.  Musculoskeletal:     Cervical back: Normal range of motion and neck supple.  Skin:    General: Skin is warm and dry.     Capillary Refill: Capillary refill takes less than 2 seconds.     Coloration: Skin is not jaundiced.     Findings: No rash.  Neurological:     General: No focal deficit present.      Mental Status: She is alert and oriented to person, place, and time.     UC Treatments / Results  Labs (all  labs ordered are listed, but only abnormal results are displayed) Labs Reviewed  POCT URINALYSIS DIP (DEVICE) - Abnormal; Notable for the following components:      Result Value   Hgb urine dipstick SMALL (*)    Protein, ur >=300 (*)    Nitrite POSITIVE (*)    All other components within normal limits  URINE CULTURE  POCT URINALYSIS DIPSTICK, ED / UC    EKG   Radiology No results found.  Procedures Procedures (including critical care time)  Medications Ordered in UC Medications - No data to display  Initial Impression / Assessment and Plan / UC Course  I have reviewed the triage vital signs and the nursing notes.  Pertinent labs & imaging results that were available during my care of the patient were reviewed by me and considered in my medical decision making (see chart for details).  Clinical impression: 1.  Acute cystitis without hematuria 2.  Increased urinary frequency 3.  Increased urinary urgency 4.  Abnormal urine odor  Treatment plan: 1.  The findings and treatment plan were discussed in detail with the patient.  Patient was in agreement. 2.  Recommended getting a UA.  It was positive for nitrites.  Negative for leukocytes.  It was a bedside point-of-care.  We will send off the culture.  In the meantime I am going to go ahead and treat her with Macrobid. 3.  Educational handouts provided. 4.  Plenty of rest, plenty fluids, Tylenol or Motrin for any fever or discomfort. 5.  I want her to flush her system with plenty of water.  She can also pick up cranberry juice or cranberry pills.  She can use Azo if she develops any dysuria. 6.  Went over the red flag signs and symptoms and what to watch for in terms of developing pyelonephritis.  All questions were encouraged and answered. 7.  Says she does not have a PCP if her symptoms persist she should seek out  care either here or at another urgent care.  If they worsen then she should go to the ER. 8.  She was stable upon discharge and she will follow-up here as needed.    Final Clinical Impressions(s) / UC Diagnoses   Final diagnoses:  Abnormal urine odor  Acute cystitis without hematuria  Urinary frequency  Urinary urgency     Discharge Instructions      As we discussed, your urine is not overwhelmingly positive but you do have some byproducts of bacterial breakdown and I am going to go ahead and treat you for a UTI.  I sent off the culture.  Someone may contact you and tell you that they need to change the antibiotic or they may contact you and tell you to stop the antibiotic. Please see educational handouts. Please flush your system with plenty of water.  You can also use cranberry juice or cranberry pills. If you develop any fevers or back pain it could be a sign that you are developing a kidney infection which you should go to the ER for.      ED Prescriptions     Medication Sig Dispense Auth. Provider   nitrofurantoin, macrocrystal-monohydrate, (MACROBID) 100 MG capsule Take 1 capsule (100 mg total) by mouth 2 (two) times daily. 10 capsule Delton See, MD      PDMP not reviewed this encounter.   Delton See, MD 06/03/21 2119

## 2021-06-03 NOTE — ED Triage Notes (Signed)
PT reports urinary urgency. Denies dysuria. States this feels like a normal UTI for her. Symptoms started Tuesday.

## 2021-06-04 ENCOUNTER — Ambulatory Visit: Payer: BC Managed Care – PPO

## 2021-06-06 LAB — URINE CULTURE: Culture: >=100000 — AB

## 2021-10-31 ENCOUNTER — Other Ambulatory Visit: Payer: Self-pay

## 2021-10-31 ENCOUNTER — Encounter: Payer: Self-pay | Admitting: Emergency Medicine

## 2021-10-31 ENCOUNTER — Emergency Department
Admission: EM | Admit: 2021-10-31 | Discharge: 2021-10-31 | Disposition: A | Discharge status: Home / Self Care | Payer: BC Managed Care – PPO | Attending: Emergency Medicine | Admitting: Emergency Medicine

## 2021-10-31 ENCOUNTER — Emergency Department: Payer: BC Managed Care – PPO

## 2021-10-31 DIAGNOSIS — Z794 Long term (current) use of insulin: Secondary | ICD-10-CM | POA: Diagnosis not present

## 2021-10-31 DIAGNOSIS — U071 COVID-19: Secondary | ICD-10-CM | POA: Diagnosis not present

## 2021-10-31 DIAGNOSIS — F17210 Nicotine dependence, cigarettes, uncomplicated: Secondary | ICD-10-CM | POA: Diagnosis not present

## 2021-10-31 DIAGNOSIS — R059 Cough, unspecified: Secondary | ICD-10-CM | POA: Diagnosis present

## 2021-10-31 DIAGNOSIS — E119 Type 2 diabetes mellitus without complications: Secondary | ICD-10-CM | POA: Diagnosis not present

## 2021-10-31 LAB — RESP PANEL BY RT-PCR (FLU A&B, COVID) ARPGX2
Influenza A by PCR: NEGATIVE
Influenza B by PCR: NEGATIVE
SARS Coronavirus 2 by RT PCR: POSITIVE — AB

## 2021-10-31 MED ORDER — GUAIFENESIN-CODEINE 100-10 MG/5ML PO SYRP: 10.0000 mL | ORAL_SOLUTION | Freq: Three times a day (TID) | ORAL | 0 refills | Status: DC | PRN

## 2021-10-31 NOTE — ED Notes (Signed)
Dc ppw provided to patient. Followup information provided. RX information given. Questions answered. Pt provides verbal consent for discharge. Pt assisted off unit. 

## 2021-10-31 NOTE — ED Triage Notes (Signed)
Pt comes into the ED via POV c/o SHOB, cough, and nasal congestion.  Pt states her symptoms started Thursday night.  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  Pt currently has even and unlabored respirations.

## 2021-10-31 NOTE — ED Provider Notes (Signed)
[facility] Emergency Department Provider Note  ____________________________________________  Time seen: Approximately 11:14 AM  I have reviewed the triage vital signs and the nursing notes.   HISTORY  Chief Complaint Cough, Generalized Body Aches, Sore Throat, and Shortness of Breath   HPI Destiny Luna is a 50 y.o. female presenting to the emergency department with 2 days of cough, nasal congestion, and some shortness of breath.  No relief with extra strength Tylenol.  Past Medical History:  Diagnosis Date   Diabetes mellitus without complication (HCC)     There are no problems to display for this patient.   Past Surgical History:  Procedure Laterality Date   CESAREAN SECTION     HERNIA REPAIR      Prior to Admission medications   Medication Sig Start Date End Date Taking? Authorizing Provider  guaiFENesin-codeine (ROBITUSSIN AC) 100-10 MG/5ML syrup Take 10 mLs by mouth 3 (three) times daily as needed for cough. 10/31/21  Yes Koi Yarbro B, FNP  clotrimazole-betamethasone (LOTRISONE) cream Apply to affected area 2 times daily. 05/02/21   Tommie Sams, DO  nitrofurantoin, macrocrystal-monohydrate, (MACROBID) 100 MG capsule Take 1 capsule (100 mg total) by mouth 2 (two) times daily. 06/03/21   Delton See, MD  NOVOLIN N FLEXPEN RELION 100 UNIT/ML Kiwkpen Inject into the skin. 09/15/20   [provider]  fluticasone (FLONASE) 50 MCG/ACT nasal spray Place 2 sprays into both nostrils daily. 03/27/20 09/26/20  Domenick Gong, MD  insulin glargine (LANTUS) 100 UNIT/ML injection Inject 25 Units into the skin daily. 25-30 units  05/02/21  [provider]  levocetirizine (XYZAL) 5 MG tablet Take 1 tablet (5 mg total) by mouth every evening. 03/27/20 09/26/20  Domenick Gong, MD    Allergies Patient has no known allergies.  Family History  Problem Relation Age of Onset   Diabetes Mother    CAD Father        CABG x 3   Stroke  Father    Diabetes Father     Social History Social History   Tobacco Use   Smoking status: Every Day    Packs/day: 0.25    Years: 4.00    Pack years: 1.00    Types: Cigarettes   Smokeless tobacco: Never   Tobacco comments:    quit for 8 years and restarted  Vaping Use   Vaping Use: Never used  Substance Use Topics   Alcohol use: Yes    Comment: rarely   Drug use: Never    Review of Systems Constitutional: Positive for fever/chills. Decreased appetite. ENT: Positive for sore throat. Cardiovascular: Denies chest pain. Respiratory: Positive for shortness of breath. Positive for cough. Negative for wheezing.  Gastrointestinal: No nausea,  no vomiting.  no diarrhea.  Musculoskeletal: Positive for body aches Skin: Negative for rash. Neurological: Positive for headaches ____________________________________________   PHYSICAL EXAM:  VITAL SIGNS: ED Triage Vitals [10/31/21 1012]  Enc Vitals Group     BP      Pulse      Resp      Temp      Temp src      SpO2      Weight 255 lb 1.2 oz (115.7 kg)     Height 5\' 5"  (1.651 m)     Head Circumference      Peak Flow      Pain Score 6     Pain Loc      Pain Edu?      Excl. in  [place]?     Constitutional: Alert and oriented. No acute distress. Eyes: Conjunctivae are normal. Ears: Injected. Nose: Maxillary sinus congestion noted; no rhinnorhea. Mouth/Throat: Mucous membranes are moist.  Oropharynx erythematous. Tonsils without exudate. Uvula midline. Neck: No stridor.  Lymphatic: No cervical lymphadenopathy. Cardiovascular: Normal rate, regular rhythm. Good peripheral circulation. Respiratory: Respirations are even and unlabored.  No retractions. Breath sounds clear. Gastrointestinal: Soft and nontender.  Musculoskeletal: FROM x 4 extremities.  Neurologic:  Normal speech and language. Skin:  Skin is warm, dry and intact. No rash noted. Psychiatric: Mood and affect are normal. Speech and behavior are  normal.  ____________________________________________   LABS (all labs ordered are listed, but only abnormal results are displayed)  Labs Reviewed  RESP PANEL BY RT-PCR (FLU A&B, COVID) ARPGX2 - Abnormal; Notable for the following components:      Result Value   SARS Coronavirus 2 by RT PCR POSITIVE (*)    All other components within normal limits   ____________________________________________  EKG  Not indicated. ____________________________________________  RADIOLOGY  Chest x-ray without acute concerns. ____________________________________________   PROCEDURES  Procedure(s) performed: None  Critical Care performed: No ____________________________________________   INITIAL IMPRESSION / ASSESSMENT AND PLAN / ED COURSE  50 y.o. female presenting to the emergency department for treatment and evaluation of viral symptoms as described in the HPI.  Chest x-ray negative for acute concerns.  Awaiting results of COVID and influenza testing.  Exam is overall reassuring.  COVID-positive.  Encouraged to continue Tylenol for body aches or fever.  She states that her cough is her worst symptom.  Robitussin-AC sent to her pharmacy.  Work excuse provided.  Quarantine instructions discussed.  She is to follow-up with primary care or return to the emergency department for symptoms that change or worsen.    Medications - No data to display  ED Discharge Orders          Ordered    guaiFENesin-codeine (ROBITUSSIN AC) 100-10 MG/5ML syrup  3 times daily PRN        10/31/21 1158             Pertinent labs & imaging results that were available during my care of the patient were reviewed by me and considered in my medical decision making (see chart for details).    If controlled substance prescribed during this visit, 12 month history viewed on the [facility] prior to issuing an initial prescription for Schedule II or III opiod. ____________________________________________   FINAL  CLINICAL IMPRESSION(S) / ED DIAGNOSES  Final diagnoses:  COVID    Note:  This document was prepared using Dragon voice recognition software and may include unintentional dictation errors.     Chinita Pester, FNP 10/31/21 1224    Minna Antis, MD 10/31/21 1456

## 2021-10-31 NOTE — ED Notes (Signed)
Vs declined at dc

## 2021-10-31 NOTE — ED Provider Notes (Signed)
HPI: Pt is a 50 y.o. female who presents with complaints of cough   The patient p/w  cough and concern for covid.   ROS: Denies fever, chest pain, vomiting  Past Medical History:  Diagnosis Date   Diabetes mellitus without complication (HCC)    There were no vitals filed for this visit.  Focused Physical Exam: Gen: No acute distress Head: atraumatic, normocephalic Eyes: Extraocular movements grossly intact; conjunctiva clear CV: RRR Lung: No increased WOB, no stridor GI: ND, no obvious masses Neuro: Alert and awake  Medical Decision Making and Plan: Given the patient's initial medical screening exam, the following diagnostic evaluation has been ordered. The patient will be placed in the appropriate treatment space, once one is available, to complete the evaluation and treatment. I have discussed the plan of care with the patient and I have advised the patient that an ED physician or mid-level practitioner will reevaluate their condition after the test results have been received, as the results may give them additional insight into the type of treatment they may need.   Diagnostics: covid, chest xray   Treatments: none immediately   Concha Se, MD 10/31/21 1012

## 2021-11-03 ENCOUNTER — Emergency Department
Admission: EM | Admit: 2021-11-03 | Discharge: 2021-11-03 | Disposition: A | Discharge status: Home / Self Care | Payer: BC Managed Care – PPO | Attending: Emergency Medicine | Admitting: Emergency Medicine

## 2021-11-03 ENCOUNTER — Other Ambulatory Visit: Payer: Self-pay

## 2021-11-03 DIAGNOSIS — F17210 Nicotine dependence, cigarettes, uncomplicated: Secondary | ICD-10-CM | POA: Diagnosis not present

## 2021-11-03 DIAGNOSIS — J029 Acute pharyngitis, unspecified: Secondary | ICD-10-CM | POA: Insufficient documentation

## 2021-11-03 DIAGNOSIS — Z794 Long term (current) use of insulin: Secondary | ICD-10-CM | POA: Insufficient documentation

## 2021-11-03 DIAGNOSIS — U071 COVID-19: Secondary | ICD-10-CM | POA: Insufficient documentation

## 2021-11-03 DIAGNOSIS — E119 Type 2 diabetes mellitus without complications: Secondary | ICD-10-CM | POA: Insufficient documentation

## 2021-11-03 MED ORDER — PREDNISONE 20 MG PO TABS
40.0000 mg | ORAL_TABLET | Freq: Every day | ORAL | 0 refills | Status: AC
Start: 2021-11-03 — End: 2021-11-08

## 2021-11-03 MED ORDER — AMOXICILLIN-POT CLAVULANATE 875-125 MG PO TABS: 1.0000 | ORAL_TABLET | Freq: Two times a day (BID) | ORAL | 0 refills | Status: AC

## 2021-11-03 MED ORDER — HYDROCODONE BIT-HOMATROP MBR 5-1.5 MG/5ML PO SOLN: 5.0000 mL | Freq: Four times a day (QID) | ORAL | 0 refills | Status: DC | PRN

## 2021-11-03 NOTE — ED Provider Notes (Signed)
[facility] Emergency Department Provider Note  ____________________________________________   Event Date/Time   First MD Initiated Contact with Patient 11/03/21 0940     (approximate)  I have reviewed the triage vital signs and the nursing notes.   HISTORY  Chief Complaint Nasal Congestion, Headache, and Sore Throat    HPI Destiny Luna is a 50 y.o. female here with nasal congestion, headache, and sore throat.  The patient states that she was recently diagnosed with COVID.  This is her third bout with it.  Whereas she recovered fairly quickly from the second episode, she states that over the last several days, she has had progressively worsening sore throat, headache, and fatigue.  She has noticed pain with swallowing.  No difficulty swallowing or breathing.  She is had associated chills and subjective fevers.  She had a mild cough but no shortness of breath.  No leg swelling.  No rash.  She is fully vaccinated.    Past Medical History:  Diagnosis Date   Diabetes mellitus without complication ([place])     There are no problems to display for this patient.   Past Surgical History:  Procedure Laterality Date   CESAREAN SECTION     HERNIA REPAIR      Prior to Admission medications   Medication Sig Start Date End Date Taking? Authorizing Provider  amoxicillin-clavulanate (AUGMENTIN) 875-125 MG tablet Take 1 tablet by mouth 2 (two) times daily for 10 days. 11/03/21 11/13/21 Yes Duffy Bruce, MD  HYDROcodone bit-homatropine (HYCODAN) 5-1.5 MG/5ML syrup Take 5 mLs by mouth every 6 (six) hours as needed for cough. 11/03/21  Yes Duffy Bruce, MD  predniSONE (DELTASONE) 20 MG tablet Take 2 tablets (40 mg total) by mouth daily for 5 days. 11/03/21 11/08/21 Yes Duffy Bruce, MD  clotrimazole-betamethasone (LOTRISONE) cream Apply to affected area 2 times daily. 05/02/21   Coral Spikes, DO  guaiFENesin-codeine (ROBITUSSIN AC) 100-10 MG/5ML syrup Take 10  mLs by mouth 3 (three) times daily as needed for cough. 10/31/21   Triplett, Johnette Abraham B, FNP  nitrofurantoin, macrocrystal-monohydrate, (MACROBID) 100 MG capsule Take 1 capsule (100 mg total) by mouth 2 (two) times daily. 06/03/21   Verda Cumins, MD  NOVOLIN N FLEXPEN RELION 100 UNIT/ML Kiwkpen Inject into the skin. 09/15/20   [provider]  fluticasone (FLONASE) 50 MCG/ACT nasal spray Place 2 sprays into both nostrils daily. 03/27/20 09/26/20  Melynda Ripple, MD  insulin glargine (LANTUS) 100 UNIT/ML injection Inject 25 Units into the skin daily. 25-30 units  05/02/21  [provider]  levocetirizine (XYZAL) 5 MG tablet Take 1 tablet (5 mg total) by mouth every evening. 03/27/20 09/26/20  Melynda Ripple, MD    Allergies Patient has no known allergies.  Family History  Problem Relation Age of Onset   Diabetes Mother    CAD Father        CABG x 3   Stroke Father    Diabetes Father     Social History Social History   Tobacco Use   Smoking status: Every Day    Packs/day: 0.25    Years: 4.00    Pack years: 1.00    Types: Cigarettes   Smokeless tobacco: Never   Tobacco comments:    quit for 8 years and restarted  Vaping Use   Vaping Use: Never used  Substance Use Topics   Alcohol use: Yes    Comment: rarely   Drug use: Never    Review of Systems  Review of  Systems  Constitutional:  Positive for fatigue. Negative for fever.  HENT:  Positive for congestion, rhinorrhea and sore throat.   Eyes:  Negative for visual disturbance.  Respiratory:  Positive for cough. Negative for shortness of breath.   Cardiovascular:  Negative for chest pain.  Gastrointestinal:  Negative for abdominal pain, diarrhea, nausea and vomiting.  Genitourinary:  Negative for flank pain.  Musculoskeletal:  Negative for back pain and neck pain.  Skin:  Negative for rash and wound.  Neurological:  Negative for weakness.  All other systems reviewed and are negative.    ____________________________________________  PHYSICAL EXAM:      VITAL SIGNS: ED Triage Vitals  Enc Vitals Group     BP 11/03/21 0927 (!) 135/96     Pulse Rate 11/03/21 0927 98     Resp 11/03/21 0927 20     Temp 11/03/21 0927 98.6 F (37 C)     Temp Source 11/03/21 0927 Oral     SpO2 11/03/21 0927 96 %     Weight 11/03/21 0928 260 lb (117.9 kg)     Height 11/03/21 0928 5\' 5"  (1.651 m)     Head Circumference --      Peak Flow --      Pain Score 11/03/21 0928 7     Pain Loc --      Pain Edu? --      Excl. in [place]? --      Physical Exam Vitals and nursing note reviewed.  Constitutional:      General: She is not in acute distress.    Appearance: She is well-developed.  HENT:     Head: Normocephalic and atraumatic.     Comments: 3+ tonsillar swelling with exudates bilaterally.  Uvula is midline.  No peritonsillar asymmetry.  Sublingual space is soft.  Neck is supple with no stridor. Eyes:     Conjunctiva/sclera: Conjunctivae normal.  Cardiovascular:     Rate and Rhythm: Normal rate and regular rhythm.     Heart sounds: Normal heart sounds. No murmur heard.   No friction rub.  Pulmonary:     Effort: Pulmonary effort is normal. No respiratory distress.     Breath sounds: Normal breath sounds. No wheezing or rales.  Abdominal:     General: There is no distension.     Palpations: Abdomen is soft.     Tenderness: There is no abdominal tenderness.  Musculoskeletal:     Cervical back: Neck supple.  Skin:    General: Skin is warm.     Capillary Refill: Capillary refill takes less than 2 seconds.  Neurological:     Mental Status: She is alert and oriented to person, place, and time.     Motor: No abnormal muscle tone.      ____________________________________________   LABS (all labs ordered are listed, but only abnormal results are displayed)  Labs Reviewed - No data to display  ____________________________________________  EKG:   ________________________________________  RADIOLOGY All imaging, including plain films, CT scans, and ultrasounds, independently reviewed by me, and interpretations confirmed via formal radiology reads.  ED MD interpretation:     Official radiology report(s): No results found.  ____________________________________________  PROCEDURES   Procedure(s) performed (including Critical Care):  Procedures  ____________________________________________  INITIAL IMPRESSION / MDM / [place] / ED COURSE  As part of my medical decision making, I reviewed the following data within the [place] notes reviewed and incorporated, Old chart reviewed, Notes from prior  ED visits, and [place] Controlled Substance Database       *Destiny Luna was evaluated in Emergency Department on 11/03/2021 for the symptoms described in the history of present illness. She was evaluated in the context of the global COVID-19 pandemic, which necessitated consideration that the patient might be at risk for infection with the SARS-CoV-2 virus that causes COVID-19. Institutional protocols and algorithms that pertain to the evaluation of patients at risk for COVID-19 are in a state of rapid change based on information released by regulatory bodies including the CDC and federal and state organizations. These policies and algorithms were followed during the patient's care in the ED.  Some ED evaluations and interventions may be delayed as a result of limited staffing during the pandemic.*     Medical Decision Making: 50 year old well-appearing female here with significant sore throat, fever, and malaise in the setting of third COVID-19 infection.  Clinically, concern for possible superimposed bacterial pharyngitis or sinusitis given the duration of her symptoms and exam.  There is no clinical evidence of peritonsillar abscess, the uvular asymmetry, or Ludwig's angina.  Do not suspect  epiglottitis. I do think she would benefit from antibiotics as well as a course of steroids.  Otherwise, from a COVID perspective, this is her third episode and she is fully vaccinated.  She is satting well on room air.  Normal work of breathing.  No signs of respiratory compromise.  Will discharge with supportive care and outpatient follow-up.  ____________________________________________  FINAL CLINICAL IMPRESSION(S) / ED DIAGNOSES  Final diagnoses:  Pharyngitis, unspecified etiology  COVID-19     MEDICATIONS GIVEN DURING THIS VISIT:  Medications - No data to display   ED Discharge Orders          Ordered    amoxicillin-clavulanate (AUGMENTIN) 875-125 MG tablet  2 times daily        11/03/21 1028    predniSONE (DELTASONE) 20 MG tablet  Daily        11/03/21 1028    HYDROcodone bit-homatropine (HYCODAN) 5-1.5 MG/5ML syrup  Every 6 hours PRN        11/03/21 1028             Note:  This document was prepared using Dragon voice recognition software and may include unintentional dictation errors.   Shaune Pollack, MD 11/03/21 1028

## 2021-11-03 NOTE — ED Triage Notes (Signed)
Pt states that she was diagnosed with covid on Thursday, states that her symptoms aren't improving, states that she has a headache and sore throat and reports increased difficulty swallowing due to soreness and drainage

## 2021-11-03 NOTE — ED Notes (Signed)
See triage note. EDP at bedside examining pt. Pt appears to be in NAD.

## 2021-11-21 IMAGING — DX DG TIBIA/FIBULA 2V*L*
4 series · 4 of 4 positions shown · non-contrast
Comparison: None.

CLINICAL DATA: Onset left lower leg pain today when the patient
heard a pop in her leg when she jumped off a small ledge. Initial
encounter.

EXAM:
LEFT TIBIA AND FIBULA - 2 VIEW

[tibia ap (1 of 2)]
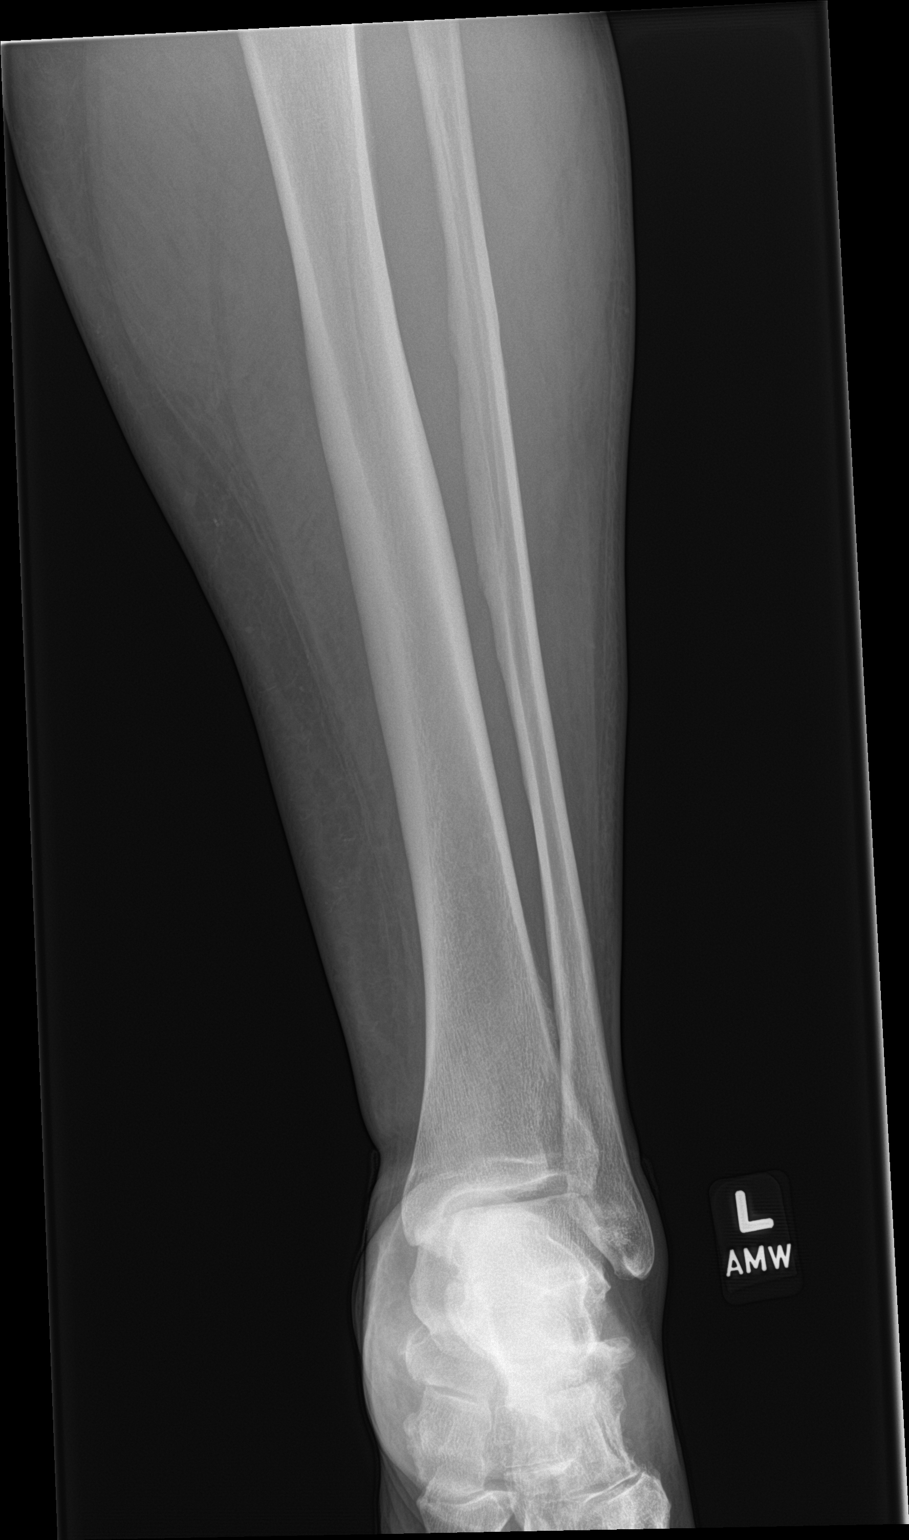

[tibia ap (2 of 2)]
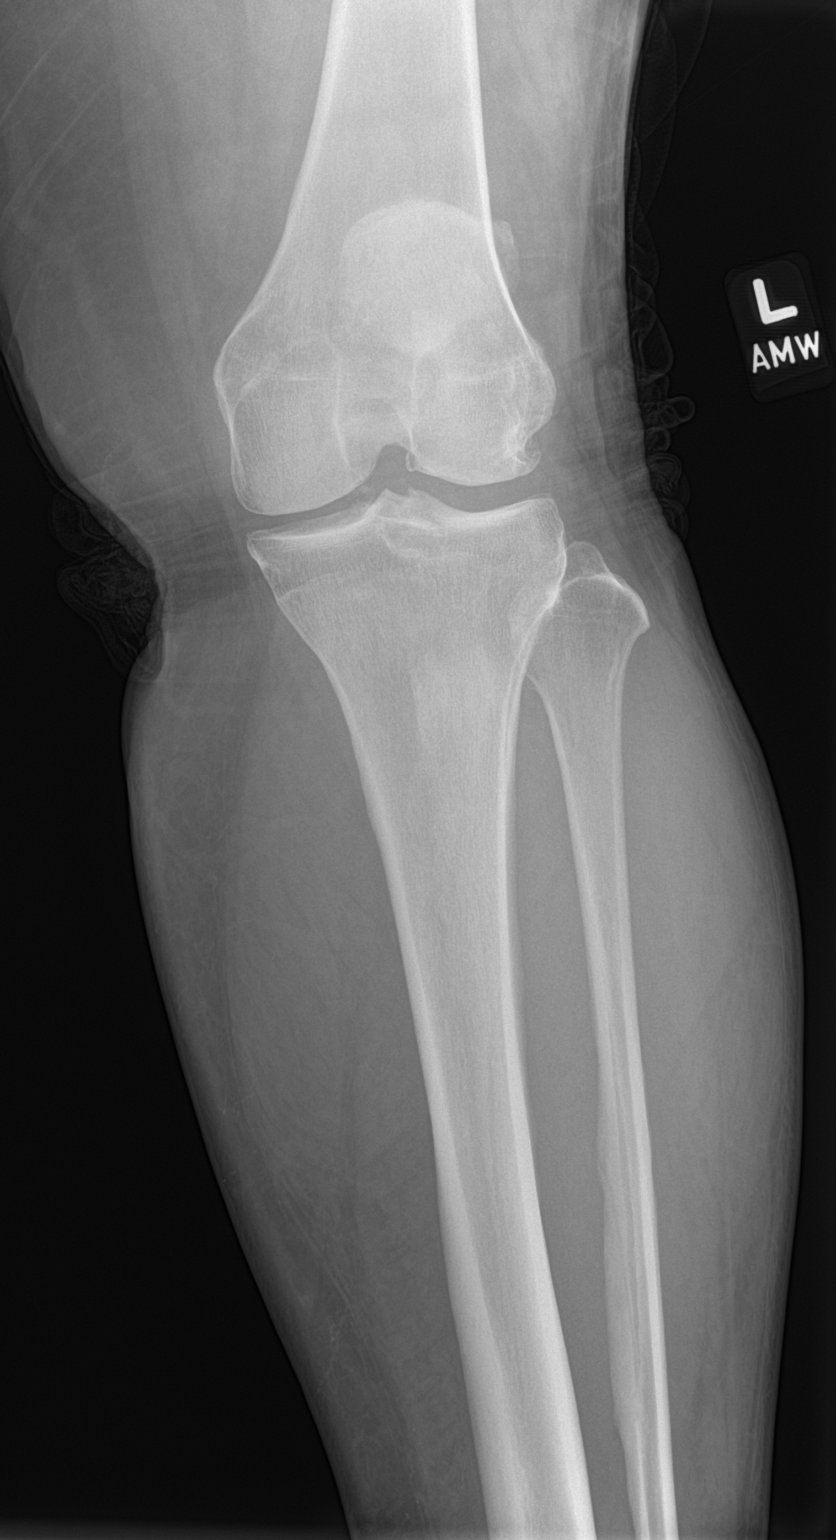

[tibia lat (1 of 2)]
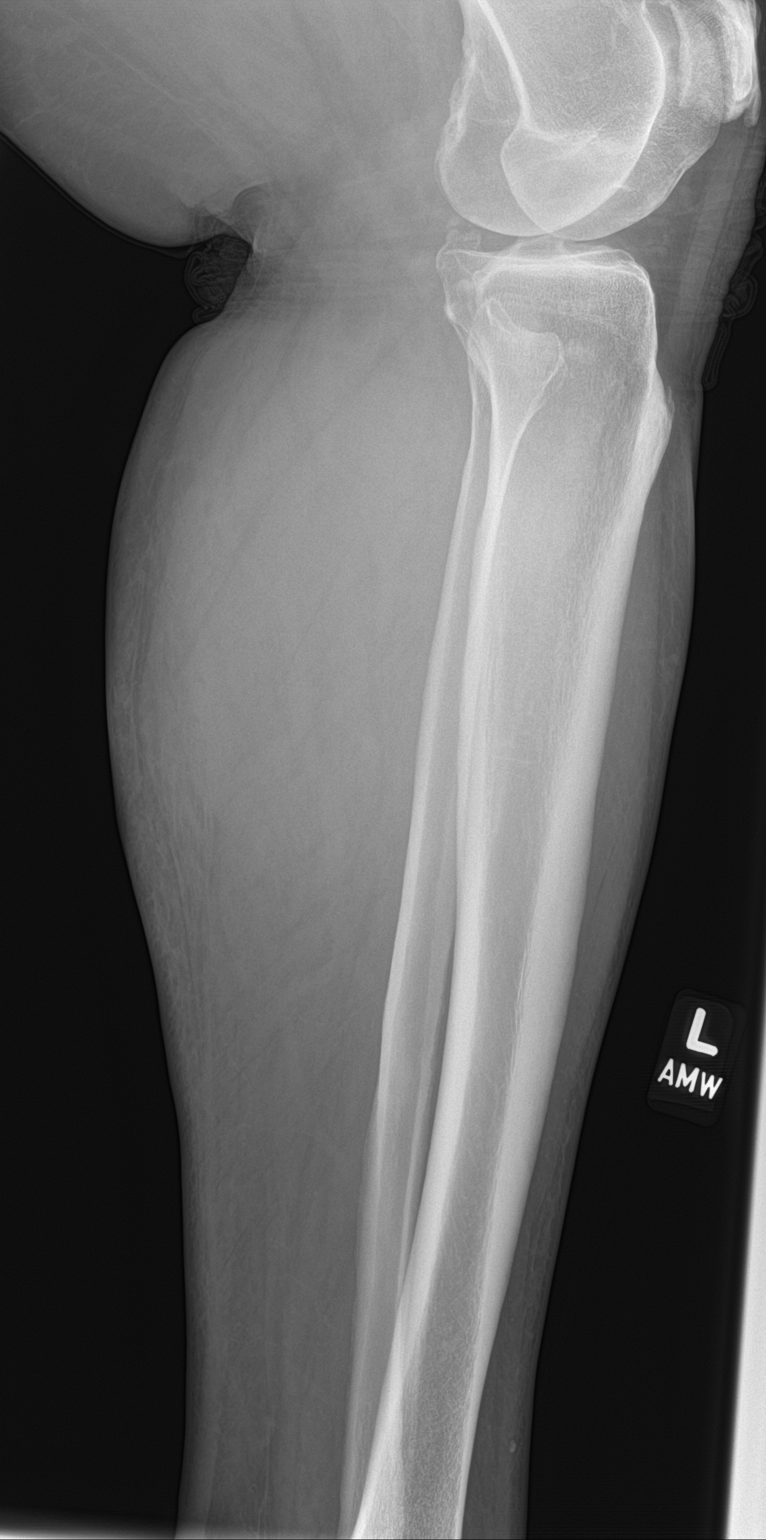

[tibia lat (2 of 2)]
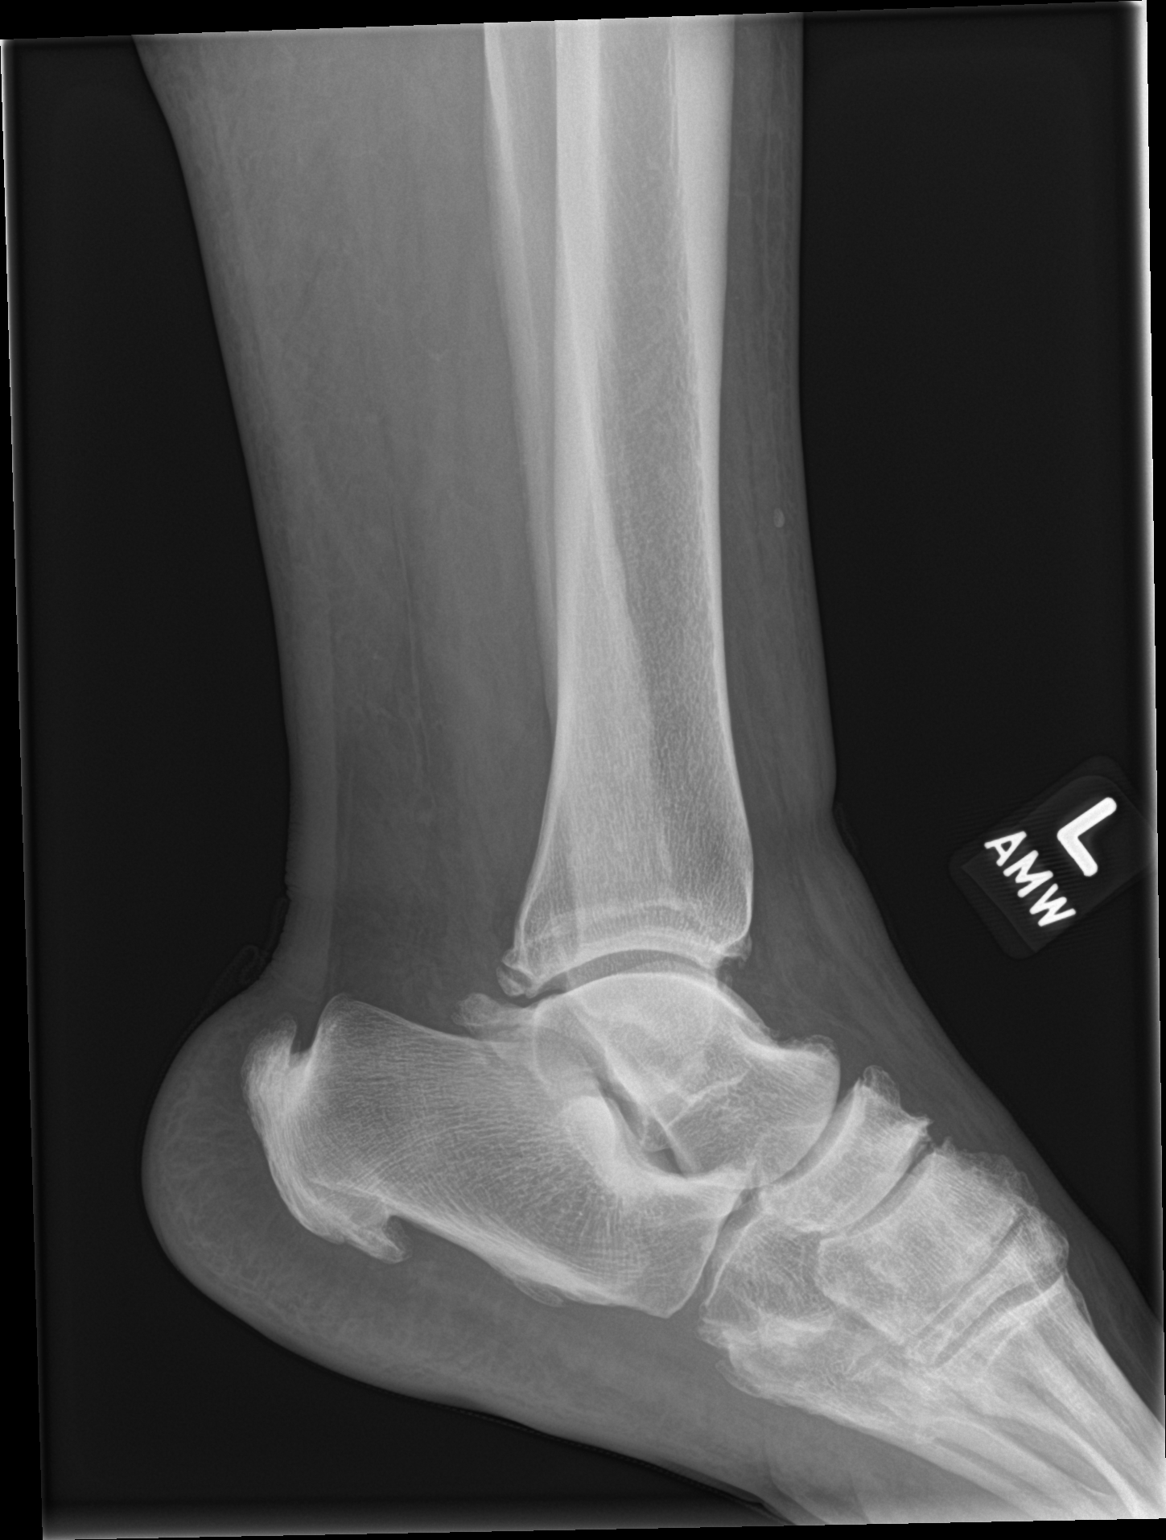

[4 of 4 positions shown; findings below may reference images not displayed]

FINDINGS: No acute bony or joint abnormality is identified. There is
tibiotalar and midfoot osteoarthritis. There is also some
osteophytosis about the knee. Dorsal and plantar calcaneal spurring
is noted. Soft tissues are negative.
IMPRESSION: No acute abnormality.

Knee, tibiotalar and midfoot osteoarthritis.

Calcaneal spurring.

## 2022-01-28 ENCOUNTER — Other Ambulatory Visit: Payer: Self-pay

## 2022-01-28 ENCOUNTER — Ambulatory Visit (INDEPENDENT_AMBULATORY_CARE_PROVIDER_SITE_OTHER): Payer: BC Managed Care – PPO | Admitting: Podiatry

## 2022-01-28 ENCOUNTER — Ambulatory Visit (INDEPENDENT_AMBULATORY_CARE_PROVIDER_SITE_OTHER): Payer: BC Managed Care – PPO

## 2022-01-28 DIAGNOSIS — M79676 Pain in unspecified toe(s): Secondary | ICD-10-CM

## 2022-01-28 DIAGNOSIS — R601 Generalized edema: Secondary | ICD-10-CM

## 2022-01-28 DIAGNOSIS — M722 Plantar fascial fibromatosis: Secondary | ICD-10-CM | POA: Diagnosis not present

## 2022-02-02 ENCOUNTER — Encounter: Payer: Self-pay | Admitting: Podiatry

## 2022-02-02 ENCOUNTER — Ambulatory Visit: Payer: BC Managed Care – PPO | Admitting: Podiatry

## 2022-02-02 NOTE — Progress Notes (Signed)
?Subjective:  ?Patient ID: Destiny Luna, female    DOB: [birth year],  MRN: [id number] ? ?Chief Complaint  ?Patient presents with  ? Foot Swelling  ?  Left foot swelling- pt reports she has been swelling on going for years now- mentioned she feels as if she is walking on bone now in heel- further evaluation   ? ? ?51 y.o. female presents with the above complaint.  Patient presents with left heel pain that has been going on for quite some time is painful to walk on.  Pain is like she is walking on a bone and is bruised.  She wanted get evaluated.  She still has some swelling.  She would like to do FMLA for next 3 months.  She has not seen anyone else prior to seeing me.  She denies any other treatment options at home. ? ? ?Review of Systems: Negative except as noted in the HPI. Denies N/V/F/Ch. ? ?Past Medical History:  ?Diagnosis Date  ? Diabetes mellitus without complication ([place])   ? ? ?Current Outpatient Medications:  ?  clotrimazole-betamethasone (LOTRISONE) cream, Apply to affected area 2 times daily., Disp: 45 g, Rfl: 0 ?  guaiFENesin-codeine (ROBITUSSIN AC) 100-10 MG/5ML syrup, Take 10 mLs by mouth 3 (three) times daily as needed for cough., Disp: 120 mL, Rfl: 0 ?  HYDROcodone bit-homatropine (HYCODAN) 5-1.5 MG/5ML syrup, Take 5 mLs by mouth every 6 (six) hours as needed for cough., Disp: 80 mL, Rfl: 0 ?  nitrofurantoin, macrocrystal-monohydrate, (MACROBID) 100 MG capsule, Take 1 capsule (100 mg total) by mouth 2 (two) times daily., Disp: 10 capsule, Rfl: 0 ?  NOVOLIN N FLEXPEN RELION 100 UNIT/ML Kiwkpen, Inject into the skin., Disp: , Rfl:  ? ?Social History  ? ?Tobacco Use  ?Smoking Status Every Day  ? Packs/day: 0.25  ? Years: 4.00  ? Pack years: 1.00  ? Types: Cigarettes  ?Smokeless Tobacco Never  ?Tobacco Comments  ? quit for 8 years and restarted  ? ? ?No Known Allergies ?Objective:  ?There were no vitals filed for this visit. ?There is no height or weight on file to calculate BMI. ?Constitutional  Well developed. ?Well nourished.  ?Vascular Dorsalis pedis pulses palpable bilaterally. ?Posterior tibial pulses palpable bilaterally. ?Capillary refill normal to all digits.  ?No cyanosis or clubbing noted. ?Pedal hair growth normal.  ?Neurologic Normal speech. ?Oriented to person, place, and time. ?Epicritic sensation to light touch grossly present bilaterally.  ?Dermatologic Nails well groomed and normal in appearance. ?No open wounds. ?No skin lesions.  ?Orthopedic: Normal joint ROM without pain or crepitus bilaterally. ?No visible deformities. ?Tender to palpation at the calcaneal tuber left. ?No pain with calcaneal squeeze left. ?Ankle ROM diminished range of motion left. ?Silf[place]old Test: positive left.  ? ?Radiographs: Taken and reviewed. No acute fractures or dislocations. No evidence of stress fracture.  Plantar heel spur present. Posterior heel spur present. Patient presents with new complaint bilateral border ingrown.  Patient did painful to touch painful walking.  Pain scale 6 out of 10 hurts with ambulation there is some drainage.  He is unable to take it on himself.  He would like removed. ? ?Assessment:  ? ?1. Plantar fasciitis of left foot   ? ?Plan:  ?Patient was evaluated and treated and all questions answered. ? ?Plantar Fasciitis, left ?- XR reviewed as above.  ?- Educated on icing and stretching. Instructions given.  ?-We will hold off a steroid injection for now.  If there is no improvement with bracing we will  discuss steroid injection in the next few weeks.  She states understanding ?- DME: Plantar fascial brace dispensed to support the medial longitudinal arch of the foot and offload pressure from the heel and prevent arch collapse during weightbearing ?- Pharmacologic management: None ? ? ?No follow-ups on file. ?

## 2022-03-11 ENCOUNTER — Ambulatory Visit: Payer: BC Managed Care – PPO | Admitting: Podiatry

## 2022-04-20 ENCOUNTER — Ambulatory Visit: Payer: BC Managed Care – PPO | Admitting: Podiatry

## 2022-05-03 ENCOUNTER — Ambulatory Visit: Payer: BC Managed Care – PPO | Admitting: Podiatry

## 2022-05-10 ENCOUNTER — Ambulatory Visit
Admission: EM | Admit: 2022-05-10 | Discharge: 2022-05-10 | Disposition: A | Discharge status: Home / Self Care | Payer: BC Managed Care – PPO | Attending: Emergency Medicine | Admitting: Emergency Medicine

## 2022-05-10 DIAGNOSIS — H66003 Acute suppurative otitis media without spontaneous rupture of ear drum, bilateral: Secondary | ICD-10-CM | POA: Diagnosis not present

## 2022-05-10 DIAGNOSIS — J069 Acute upper respiratory infection, unspecified: Secondary | ICD-10-CM | POA: Diagnosis not present

## 2022-05-10 MED ORDER — IPRATROPIUM BROMIDE 0.06 % NA SOLN: 2.0000 | Freq: Four times a day (QID) | NASAL | 12 refills | Status: DC

## 2022-05-10 MED ORDER — AMOXICILLIN-POT CLAVULANATE 875-125 MG PO TABS: 1.0000 | ORAL_TABLET | Freq: Two times a day (BID) | ORAL | 0 refills | Status: AC

## 2022-05-10 NOTE — ED Provider Notes (Signed)
[facility]    CSN: [id number] Arrival date & time: 05/10/22  1920      History   Chief Complaint Chief Complaint  Patient presents with   Ear Fullness    HPI Destiny Luna is a 51 y.o. female.   HPI  51 year old female here for evaluation of ear complaints.  Patient reports that for the last several days she has been experiencing fullness in both of her ears.  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  She is also had some ringing in both of her ears.  She does endorse mild runny nose and nasal congestion, sore throat, and a nonproductive cough.  She is also had a headache.  She denies any drainage from her ears.  Past Medical History:  Diagnosis Date   Diabetes mellitus without complication (HCC)     There are no problems to display for this patient.   Past Surgical History:  Procedure Laterality Date   CESAREAN SECTION     HERNIA REPAIR      OB History   No obstetric history on file.      Home Medications    Prior to Admission medications   Medication Sig Start Date End Date Taking? Authorizing Provider  amoxicillin-clavulanate (AUGMENTIN) 875-125 MG tablet Take 1 tablet by mouth every 12 (twelve) hours for 10 days. 05/10/22 05/20/22 Yes Becky Augusta, NP  ipratropium (ATROVENT) 0.06 % nasal spray Place 2 sprays into both nostrils 4 (four) times daily. 05/10/22  Yes Becky Augusta, NP  clotrimazole-betamethasone (LOTRISONE) cream Apply to affected area 2 times daily. 05/02/21   Tommie Sams, DO  guaiFENesin-codeine (ROBITUSSIN AC) 100-10 MG/5ML syrup Take 10 mLs by mouth 3 (three) times daily as needed for cough. 10/31/21   Triplett, Cari B, FNP  HYDROcodone bit-homatropine (HYCODAN) 5-1.5 MG/5ML syrup Take 5 mLs by mouth every 6 (six) hours as needed for cough.  11/03/21   Shaune Pollack, MD  nitrofurantoin, macrocrystal-monohydrate, (MACROBID) 100 MG capsule Take 1 capsule (100 mg total) by mouth 2 (two) times daily. 06/03/21   Delton See, MD  NOVOLIN N FLEXPEN RELION 100 UNIT/ML Kiwkpen Inject into the skin. 09/15/20   [provider]  fluticasone (FLONASE) 50 MCG/ACT nasal spray Place 2 sprays into both nostrils daily. 03/27/20 09/26/20  Domenick Gong, MD  insulin glargine (LANTUS) 100 UNIT/ML injection Inject 25 Units into the skin daily. 25-30 units  05/02/21  [provider]  levocetirizine (XYZAL) 5 MG tablet Take 1 tablet (5 mg total) by mouth every evening. 03/27/20 09/26/20  Domenick Gong, MD    Family History Family History  Problem Relation Age of Onset   Diabetes Mother    CAD Father        CABG x 3   Stroke Father    Diabetes Father     Social History Social History   Tobacco Use   Smoking status: Every Day    Packs/day: 0.25    Years: 4.00    Total pack years: 1.00    Types: Cigarettes   Smokeless tobacco: Never   Tobacco comments:    quit for 8 years and restarted  Vaping Use   Vaping Use: Never used  Substance Use Topics   Alcohol use: Yes    Comment: rarely  Drug use: Never     Allergies   Patient has no known allergies.   Review of Systems Review of Systems  Constitutional:  Negative for fever.  HENT:  Positive for congestion, ear pain, hearing loss, rhinorrhea and sore throat. Negative for ear discharge.   Respiratory:  Positive for cough. Negative for shortness of breath and wheezing.   Neurological:  Positive for headaches.  Hematological: Negative.      Physical Exam Triage Vital Signs ED Triage Vitals  Enc Vitals Group     BP      Pulse      Resp      Temp      Temp src      SpO2      Weight      Height      Head Circumference      Peak Flow      Pain Score      Pain Loc      Pain Edu?      Excl. in GC?    No data found.  Updated Vital Signs BP 119/87  (BP Location: Left Arm)   Pulse 91   Temp 98.6 F (37 C)   Resp 18   SpO2 100%   Visual Acuity Right Eye Distance:   Left Eye Distance:   Bilateral Distance:    Right Eye Near:   Left Eye Near:    Bilateral Near:     Physical Exam Vitals and nursing note reviewed.  Constitutional:      Appearance: Normal appearance. She is not ill-appearing.  HENT:     Head: Normocephalic and atraumatic.     Right Ear: Ear canal and external ear normal. There is no impacted cerumen.     Left Ear: Ear canal and external ear normal. There is no impacted cerumen.     Nose: Congestion and rhinorrhea present.     Mouth/Throat:     Mouth: Mucous membranes are moist.     Pharynx: Oropharynx is clear. Posterior oropharyngeal erythema present. No oropharyngeal exudate.  Cardiovascular:     Rate and Rhythm: Normal rate and regular rhythm.     Pulses: Normal pulses.     Heart sounds: Normal heart sounds. No murmur heard.    No friction rub. No gallop.  Pulmonary:     Effort: Pulmonary effort is normal.     Breath sounds: Normal breath sounds. No wheezing, rhonchi or rales.  Musculoskeletal:     Cervical back: Normal range of motion and neck supple.  Lymphadenopathy:     Cervical: No cervical adenopathy.  Skin:    General: Skin is warm and dry.     Capillary Refill: Capillary refill takes less than 2 seconds.     Findings: No erythema or rash.  Neurological:     General: No focal deficit present.     Mental Status: She is alert and oriented to person, place, and time.  Psychiatric:        Mood and Affect: Mood normal.        Behavior: Behavior normal.        Thought Content: Thought content normal.        Judgment: Judgment normal.      UC Treatments / Results  Labs (all labs ordered are listed, but only abnormal results are displayed) Labs Reviewed - No data to display  EKG   Radiology No results found.  Procedures Procedures (including critical care time)  Medications  Ordered in  UC Medications - No data to display  Initial Impression / Assessment and Plan / UC Course  I have reviewed the triage vital signs and the nursing notes.  Pertinent labs & imaging results that were available during my care of the patient were reviewed by me and considered in my medical decision making (see chart for details).  Patient is a very pleasant, nontoxic-appearing 51 year old female here for evaluation of bilateral ear fullness that has been going on for last several days.  She is also had runny nose, nasal congestion, sore throat, headache, and a nonproductive cough.  Her physical exam reveals bilateral erythematous and injected tympanic membranes.  Her external auditory canals are clear.  Her nasal mucosa is erythematous and edematous with clear discharge in both nares.  Oropharyngeal exam reveals mild posterior oropharyngeal erythema with clear postnasal drip.  No cervical lymphadenopathy appreciable exam.  Cardiopulmonary exam reveals S1-S2 heart sounds with regular rate and rhythm and lung sounds that are clear to auscultation all fields.  Patient's exam is consistent with bilateral otitis media and an upper respiratory infection.  I will treat her with Augmentin twice daily for 10 days for treatment of otitis media.  I will also prescribe Atrovent nasal spray to help her with her nasal congestion.  Work note provided.  Return precautions reviewed.   Final Clinical Impressions(s) / UC Diagnoses   Final diagnoses:  Non-recurrent acute suppurative otitis media of both ears without spontaneous rupture of tympanic membranes  Acute upper respiratory infection     Discharge Instructions      Take the Augmentin twice daily for 10 days with food for treatment of your ear infection.  Take an over-the-counter probiotic 1 hour after each dose of antibiotic to prevent diarrhea.  Use over-the-counter Tylenol and ibuprofen as needed for pain or fever.  Place a hot water bottle,  or heating pad, underneath your pillowcase at night to help dilate up your ear and aid in pain relief as well as resolution of the infection.  Use the Atrovent nasal spray, 2 squirts in each nostril every 6 hours, as needed for nasal congestion.  Return for reevaluation for any new or worsening symptoms.      ED Prescriptions     Medication Sig Dispense Auth. Provider   amoxicillin-clavulanate (AUGMENTIN) 875-125 MG tablet Take 1 tablet by mouth every 12 (twelve) hours for 10 days. 20 tablet Becky Augusta, NP   ipratropium (ATROVENT) 0.06 % nasal spray Place 2 sprays into both nostrils 4 (four) times daily. 15 mL Becky Augusta, NP      PDMP not reviewed this encounter.   Becky Augusta, NP 05/10/22 2000

## 2022-05-10 NOTE — Discharge Instructions (Signed)
Take the Augmentin twice daily for 10 days with food for treatment of your ear infection.  Take an over-the-counter probiotic 1 hour after each dose of antibiotic to prevent diarrhea.  Use over-the-counter Tylenol and ibuprofen as needed for pain or fever.  Place a hot water bottle, or heating pad, underneath your pillowcase at night to help dilate up your ear and aid in pain relief as well as resolution of the infection.  Use the Atrovent nasal spray, 2 squirts in each nostril every 6 hours, as needed for nasal congestion.  Return for reevaluation for any new or worsening symptoms.  

## 2022-05-10 NOTE — ED Triage Notes (Signed)
Reports bilateral ear fullness for several days.

## 2022-05-13 ENCOUNTER — Emergency Department: Payer: BC Managed Care – PPO

## 2022-05-13 ENCOUNTER — Other Ambulatory Visit: Payer: Self-pay

## 2022-05-13 ENCOUNTER — Emergency Department
Admission: EM | Admit: 2022-05-13 | Discharge: 2022-05-13 | Disposition: A | Discharge status: Home / Self Care | Payer: BC Managed Care – PPO | Attending: Emergency Medicine | Admitting: Emergency Medicine

## 2022-05-13 DIAGNOSIS — H9203 Otalgia, bilateral: Secondary | ICD-10-CM | POA: Diagnosis present

## 2022-05-13 DIAGNOSIS — J069 Acute upper respiratory infection, unspecified: Secondary | ICD-10-CM

## 2022-05-13 DIAGNOSIS — Z20822 Contact with and (suspected) exposure to covid-19: Secondary | ICD-10-CM | POA: Diagnosis not present

## 2022-05-13 DIAGNOSIS — J40 Bronchitis, not specified as acute or chronic: Secondary | ICD-10-CM | POA: Insufficient documentation

## 2022-05-13 DIAGNOSIS — H66003 Acute suppurative otitis media without spontaneous rupture of ear drum, bilateral: Secondary | ICD-10-CM | POA: Insufficient documentation

## 2022-05-13 DIAGNOSIS — E1165 Type 2 diabetes mellitus with hyperglycemia: Secondary | ICD-10-CM | POA: Insufficient documentation

## 2022-05-13 DIAGNOSIS — R739 Hyperglycemia, unspecified: Secondary | ICD-10-CM

## 2022-05-13 LAB — CBG MONITORING, ED: Glucose-Capillary: 456 mg/dL — ABNORMAL HIGH (ref 70–99)

## 2022-05-13 LAB — RESP PANEL BY RT-PCR (FLU A&B, COVID) ARPGX2
Influenza A by PCR: NEGATIVE
Influenza B by PCR: NEGATIVE
SARS Coronavirus 2 by RT PCR: NEGATIVE

## 2022-05-13 MED ORDER — ACETAMINOPHEN 500 MG PO TABS
1000.0000 mg | ORAL_TABLET | Freq: Once | ORAL | Status: AC
  Administered 2022-05-13: 1000 mg via ORAL
  Filled 2022-05-13: qty 2

## 2022-05-13 MED ORDER — IBUPROFEN 400 MG PO TABS
400.0000 mg | ORAL_TABLET | Freq: Once | ORAL | Status: AC
  Administered 2022-05-13: 400 mg via ORAL
  Filled 2022-05-13: qty 1

## 2022-05-13 MED ORDER — INSULIN ASPART 100 UNIT/ML IJ SOLN: 0.0000 [IU] | INTRAMUSCULAR | Status: DC

## 2022-05-13 MED ORDER — LACTATED RINGERS IV BOLUS: 1000.0000 mL | Freq: Once | INTRAVENOUS | Status: DC

## 2022-05-13 NOTE — ED Notes (Signed)
ED staff rounded on pt several times, pt was not found to be in her room by several staff members.

## 2022-05-13 NOTE — ED Provider Notes (Signed)
[facility] Provider Note    Event Date/Time   First MD Initiated Contact with Patient 05/13/22 1818     (approximate)   History   Otalgia   HPI  Destiny Luna is a 51 y.o. female with past medical history of DM and tobacco abuse with patient stating she quit about 2 weeks ago as well as recent diagnosis of URI and bronchitis on 6/13 discharged with a prescription for Augmentin and Atrovent nasal spray who presents for evaluation of ongoing fullness and pain in both ears, mild cough and sore throat.  No fevers, vomiting, diarrhea, abdominal pain, back pain, urinary symptoms rash or extremity pain.  Patient has not taken any Tylenol ibuprofen.  Patient does state she takes Novolin for insulin and took it yesterday but has not yet taken it today and usually takes in the evening.  She does states she has not been keeping close track of her sugars over last couple days.    Past Medical History:  Diagnosis Date   Diabetes mellitus without complication [facility])      Physical Exam  Triage Vital Signs: ED Triage Vitals  Enc Vitals Group     BP 05/13/22 1722 133/86     Pulse Rate 05/13/22 1722 (!) 108     Resp 05/13/22 1722 18     Temp 05/13/22 1722 98.9 F (37.2 C)     Temp Source 05/13/22 1722 Oral     SpO2 05/13/22 1722 100 %     Weight 05/13/22 1720 250 lb (113.4 kg)     Height 05/13/22 1720 5\' 5"  (1.651 m)     Head Circumference --      Peak Flow --      Pain Score 05/13/22 1720 7     Pain Loc --      Pain Edu? --      Excl. in GC? --     Most recent vital signs: Vitals:   05/13/22 1722  BP: 133/86  Pulse: (!) 108  Resp: 18  Temp: 98.9 F (37.2 C)  SpO2: 100%    General: Awake, comfortable appearing. CV:  Good peripheral perfusion.  2+ radial pulses.  Very slightly tachycardic. Resp:  Normal effort.  Clear bilaterally Abd:  No distention.  Soft. Other:  Bilateral bulging and some erythema of the tympanic membranes without any  involvement of the external auditory canal.  Mastoid areas unremarkable laterally.  Posterior oropharynx erythema without exudates tonsillar enlargement or uvular deviation.   ED Results / Procedures / Treatments  Labs (all labs ordered are listed, but only abnormal results are displayed) Labs Reviewed  CBG MONITORING, ED - Abnormal; Notable for the following components:      Result Value   Glucose-Capillary 456 (*)    All other components within normal limits  RESP PANEL BY RT-PCR (FLU A&B, COVID) ARPGX2  CBC WITH DIFFERENTIAL/PLATELET  BASIC METABOLIC PANEL     EKG   RADIOLOGY Chest reviewed by myself shows no focal consoidation, effusion, edema, pneumothorax or other clear acute thoracic process. I also reviewed radiology interpretation and agree with findings described.    PROCEDURES:  Critical Care performed: No  Procedures    MEDICATIONS ORDERED IN ED: Medications  lactated ringers bolus 1,000 mL (has no administration in time range)  insulin aspart (novoLOG) injection 0-15 Units (has no administration in time range)  acetaminophen (TYLENOL) tablet 1,000 mg (1,000 mg Oral Given 05/13/22 1839)  ibuprofen (ADVIL) tablet 400 mg (400 mg Oral  Given 05/13/22 1839)     IMPRESSION / MDM / ASSESSMENT AND PLAN / ED COURSE  I reviewed the triage vital signs and the nursing notes. Patient's presentation is most consistent with acute presentation with potential threat to life or bodily function.                               Differential diagnosis includes, but is not limited to ongoing symptoms related to otitis media and likely sinusitis and a component of bronchitis and pharyngitis.  History and exam is not suggestive of peritonsillar abscess, retropharyngeal abscess mastoiditis sepsis or meningitis.  Given she has been on biotics for 2 and half days I think there is low utility in checking a strep screen as Augmentin would likely cover this.  She does states she did not  take her diabetes medicines today so we will check a CBG.  This x-ray pain has no evidence of a focal infiltrate to suggest ongoing bacterial pneumonia or any evidence of pulmonary edema suggest a component of heart failure.  CBG is 456.  Given this is fairly elevated with patient reporting poor oral intake the last couple days due to decreased appetite we will check her kidney function to assess for any evidence of kidney injury or any evidence of an acidosis.  In the meantime we will give one-time dose of sliding-scale insulin and hydrate with IV fluids.  We will also check COVID influenza PCR.  Care patient signed within provider approximately 0 730 with plan to follow-up labs and reassess.  If there is no evidence of acidosis or kidney injury and patient can likely be safely discharged with close outpatient PCP follow-up to see if her diabetes medicines need to be adjusted.      FINAL CLINICAL IMPRESSION(S) / ED DIAGNOSES   Final diagnoses:  Non-recurrent acute suppurative otitis media of both ears without spontaneous rupture of tympanic membranes  Bronchitis  Upper respiratory tract infection, unspecified type  Hyperglycemia     Rx / DC Orders   ED Discharge Orders     None        Note:  This document was prepared using Dragon voice recognition software and may include unintentional dictation errors.   Gilles Chiquito, MD 05/13/22 [phone]

## 2022-05-13 NOTE — ED Provider Notes (Signed)
-----------------------------------------   8:28 PM on 05/13/2022 -----------------------------------------  Blood pressure 133/86, pulse (!) 108, temperature 98.9 F (37.2 C), temperature source Oral, resp. rate 18, height 5\' 5"  (1.651 m), weight 113.4 kg, SpO2 100 %.  Assuming care from Dr. .  In short, Timmia Cogburn is a 51 y.o. female with a chief complaint of Otalgia .  Refer to the original H&P for additional details.    Assumed patient care from attending Dr. 44.  Patient eloped from the emergency department before care plan to be completed.   Terrilee Files Celina, PA-C 05/13/22 2029    2030, MD 05/13/22 2044

## 2022-05-13 NOTE — ED Triage Notes (Signed)
Pt states that she has bilat ear infections- pt was seen at UC 3 days ago for same and was given oral antibiotic and nasal spray- pt states she does not feel any better

## 2022-07-07 ENCOUNTER — Ambulatory Visit (INDEPENDENT_AMBULATORY_CARE_PROVIDER_SITE_OTHER): Payer: BC Managed Care – PPO | Admitting: Podiatry

## 2022-07-07 DIAGNOSIS — L97511 Non-pressure chronic ulcer of other part of right foot limited to breakdown of skin: Secondary | ICD-10-CM | POA: Diagnosis not present

## 2022-07-07 NOTE — Progress Notes (Signed)
  Subjective:  Patient ID: Destiny Luna, female    DOB: 01-08-1971,  MRN: [id number]  Chief Complaint  Patient presents with   Toe Pain    51 y.o. female presents with the above complaint.  Patient presents with right second digit dorsal ulceration limited to the breakdown of the skin.  Patient states he just started happening she wore new shoes which rubbed a sore on top of the toe.  She wanted to get it evaluated make sure that there is nothing going on.  She is diabetic with uncontrolled A1c of 10%.  She states she is working on sugar control.  It does not hurt but she would does not want to lose the toe.  She wants to make sure is not infected.   Review of Systems: Negative except as noted in the HPI. Denies N/V/F/Ch.  Past Medical History:  Diagnosis Date   Diabetes mellitus without complication (HCC)     Current Outpatient Medications:    clotrimazole-betamethasone (LOTRISONE) cream, Apply to affected area 2 times daily., Disp: 45 g, Rfl: 0   guaiFENesin-codeine (ROBITUSSIN AC) 100-10 MG/5ML syrup, Take 10 mLs by mouth 3 (three) times daily as needed for cough., Disp: 120 mL, Rfl: 0   ipratropium (ATROVENT) 0.06 % nasal spray, Place 2 sprays into both nostrils 4 (four) times daily., Disp: 15 mL, Rfl: 12   NOVOLIN N FLEXPEN RELION 100 UNIT/ML Kiwkpen, Inject into the skin., Disp: , Rfl:   Social History   Tobacco Use  Smoking Status Every Day   Packs/day: 0.25   Years: 4.00   Total pack years: 1.00   Types: Cigarettes  Smokeless Tobacco Never  Tobacco Comments   quit for 8 years and restarted    No Known Allergies Objective:  There were no vitals filed for this visit. There is no height or weight on file to calculate BMI. Constitutional Well developed. Well nourished.  Vascular Dorsalis pedis pulses palpable bilaterally. Posterior tibial pulses palpable bilaterally. Capillary refill normal to all digits.  No cyanosis or clubbing noted. Pedal hair growth normal.   Neurologic Normal speech. Oriented to person, place, and time. Epicritic sensation to light touch grossly present bilaterally.  Dermatologic Hyperkeratotic lesion noted with limited to the breakdown of skin the right second digit.  Pain on palpation mildly.  Hammertoe contracture second digit noted.  No erythema noted no purulent drainage noted.  Does not probe down to bone.  Orthopedic: Normal joint ROM without pain or crepitus bilaterally. No visible deformities. No bony tenderness.   Radiographs: None Assessment:   1. Right second toe ulcer, limited to breakdown of skin [facility])    Plan:  Patient was evaluated and treated and all questions answered.  Right second digit superficial ulceration limited to the breakdown of the skin -All questions and concerns were discussed with the patient in extensive detail.  Given the ulcer does not probe down to bone or any clinically infected signs of infection I believe patient will benefit from making shoe gear modification and applying triple antibiotic and a Band-Aid to take the pressure off and keep it covered.  I discussed this with patient she states understanding will do so immediately if it gets worse or start to develop right side of asked her to come back and see me.  She states understanding.  No follow-ups on file.

## 2022-08-21 IMAGING — CR DG CHEST 2V
1 series · 2 of 2 positions shown · non-contrast
Comparison: Chest x-ray dated 04/27/2020.

CLINICAL DATA: Cough, shortness of breath, nasal congestion.

EXAM:
CHEST - 2 VIEW

[Series 1: dg chest 2 view · 0.14mm/px · 2 of 2 slices shown]
[im 1/2]
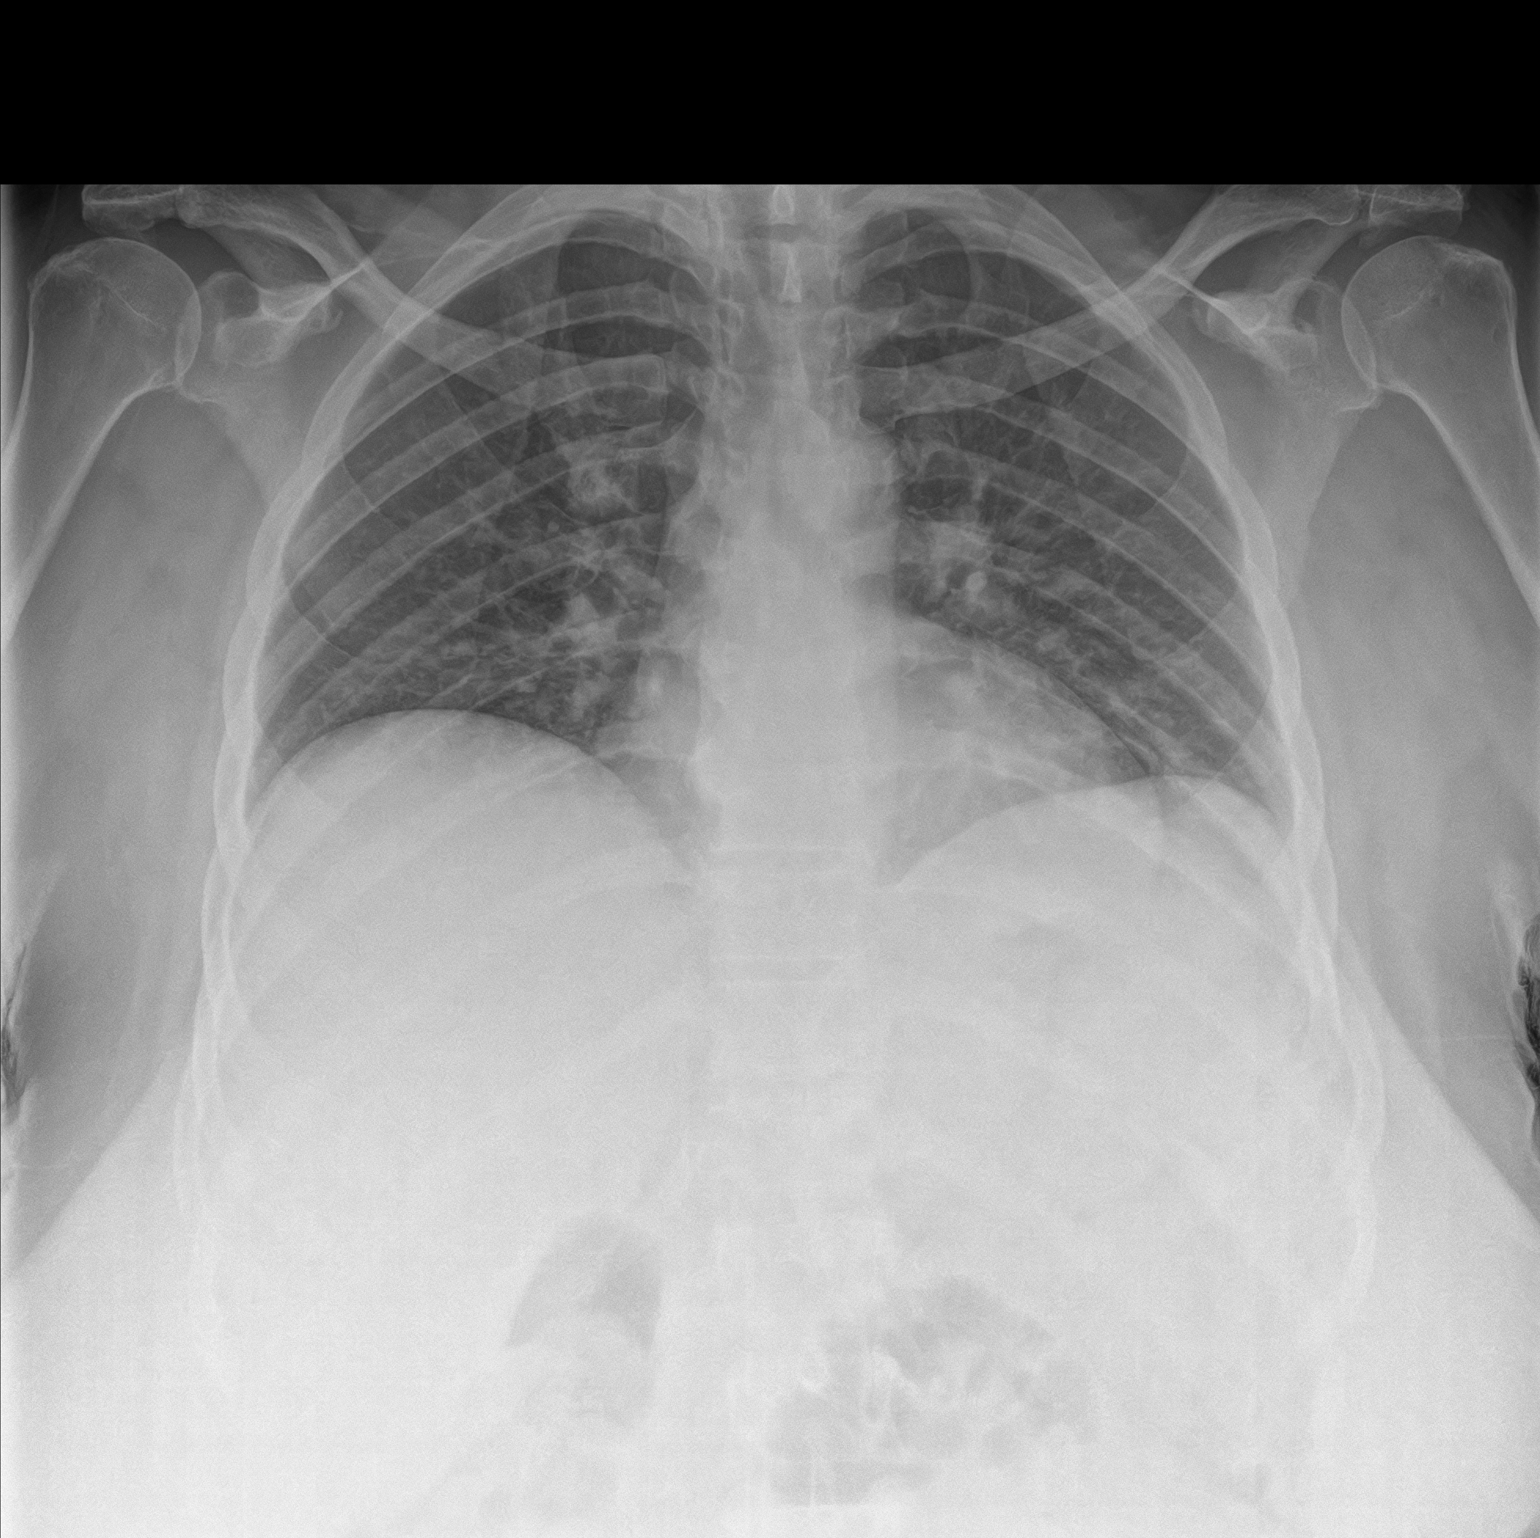
[im 2/2]
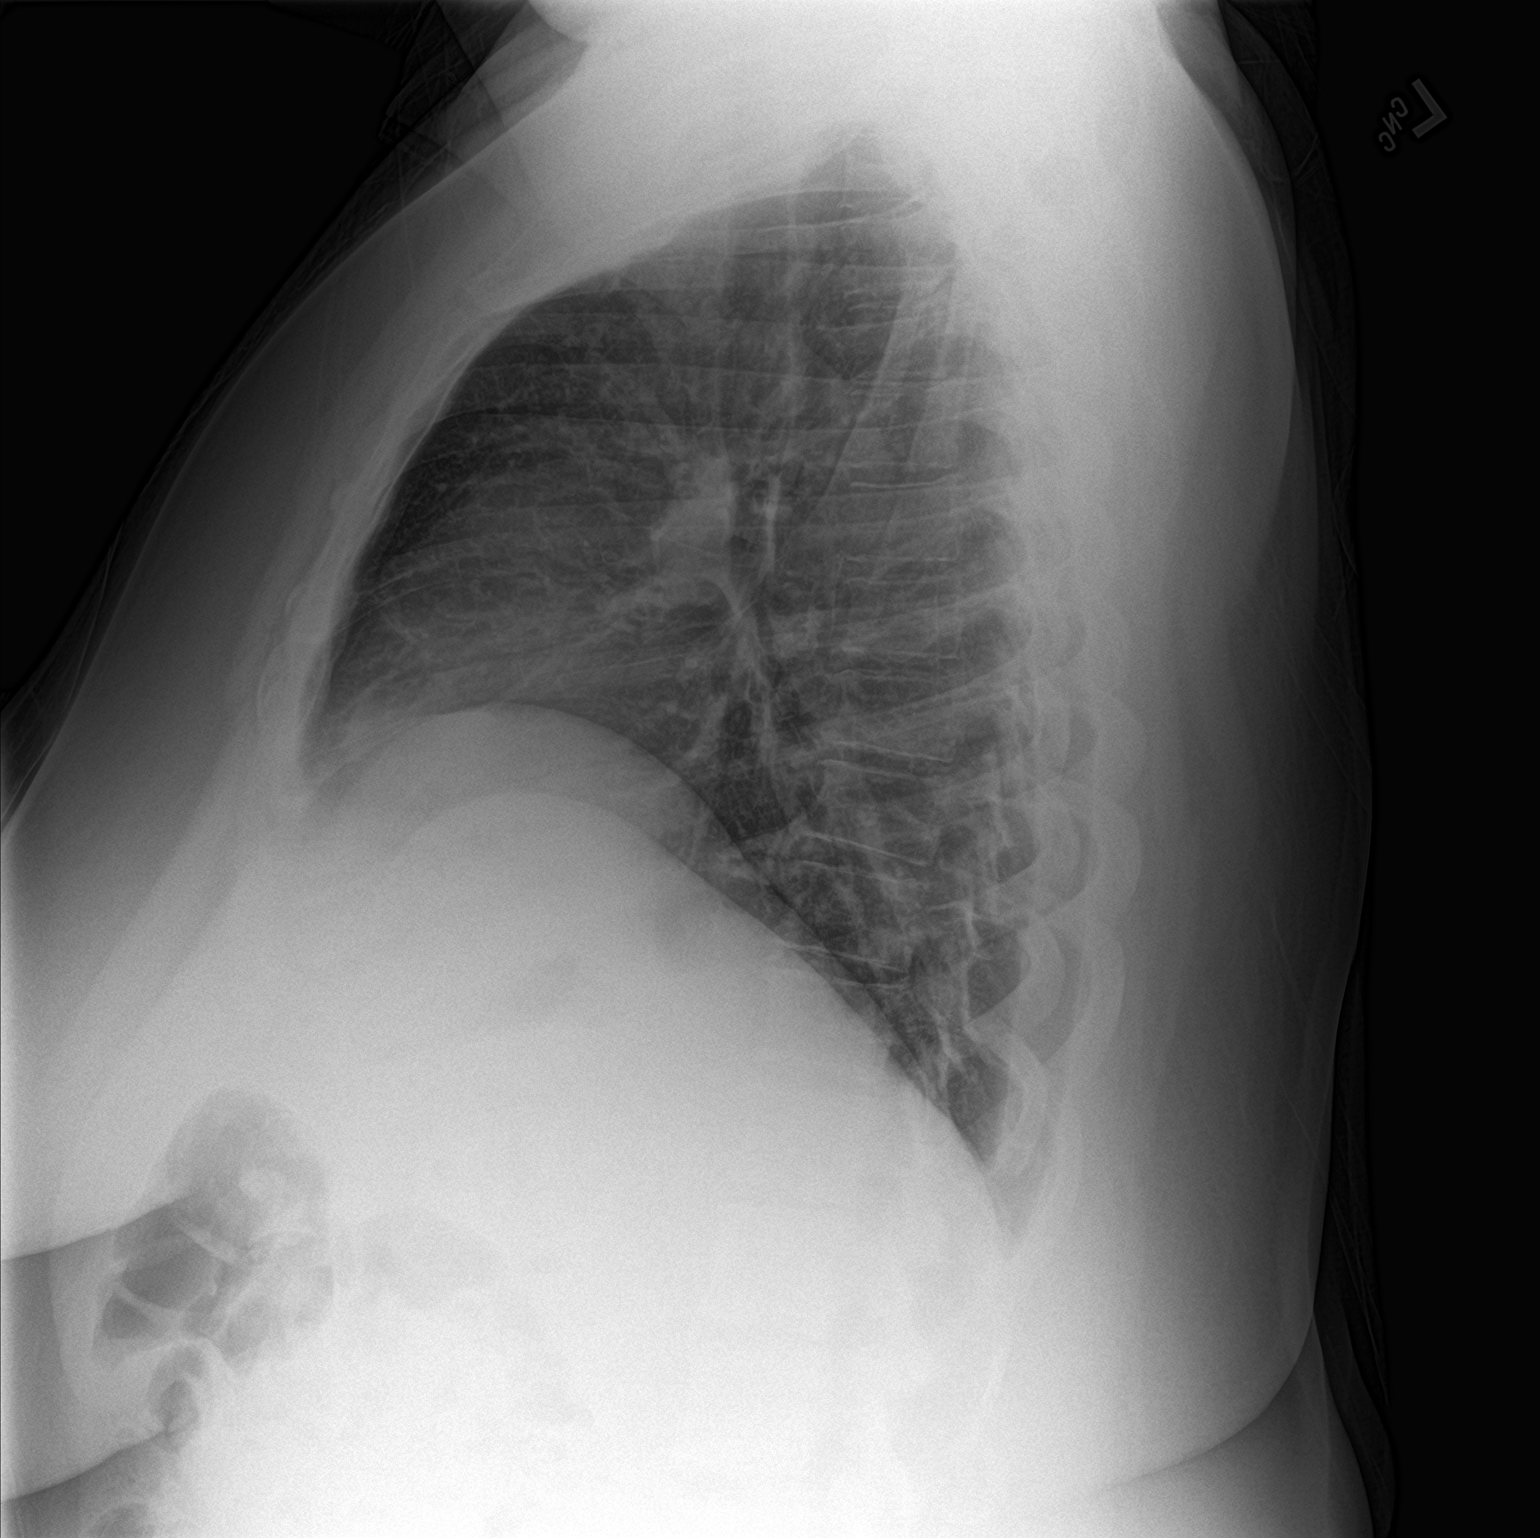

[2 of 2 positions shown; findings below may reference images not displayed]

FINDINGS: Heart size and mediastinal contours are within normal limits. Lungs
are clear. No pleural effusion or pneumothorax is seen. Osseous
structures about the chest are unremarkable.
IMPRESSION: Normal chest x-ray.  No evidence of pneumonia.

## 2023-03-03 ENCOUNTER — Ambulatory Visit (INDEPENDENT_AMBULATORY_CARE_PROVIDER_SITE_OTHER): Payer: BC Managed Care – PPO | Admitting: Podiatry

## 2023-03-03 ENCOUNTER — Encounter: Payer: Self-pay | Admitting: Podiatry

## 2023-03-03 DIAGNOSIS — L84 Corns and callosities: Secondary | ICD-10-CM

## 2023-03-03 DIAGNOSIS — E0840 Diabetes mellitus due to underlying condition with diabetic neuropathy, unspecified: Secondary | ICD-10-CM

## 2023-03-03 IMAGING — CR DG CHEST 2V
2 series · 2 of 2 positions shown · non-contrast
Comparison: 10/31/2021

CLINICAL DATA: Cough, ear infections

EXAM:
CHEST - 2 VIEW

[chest pa]
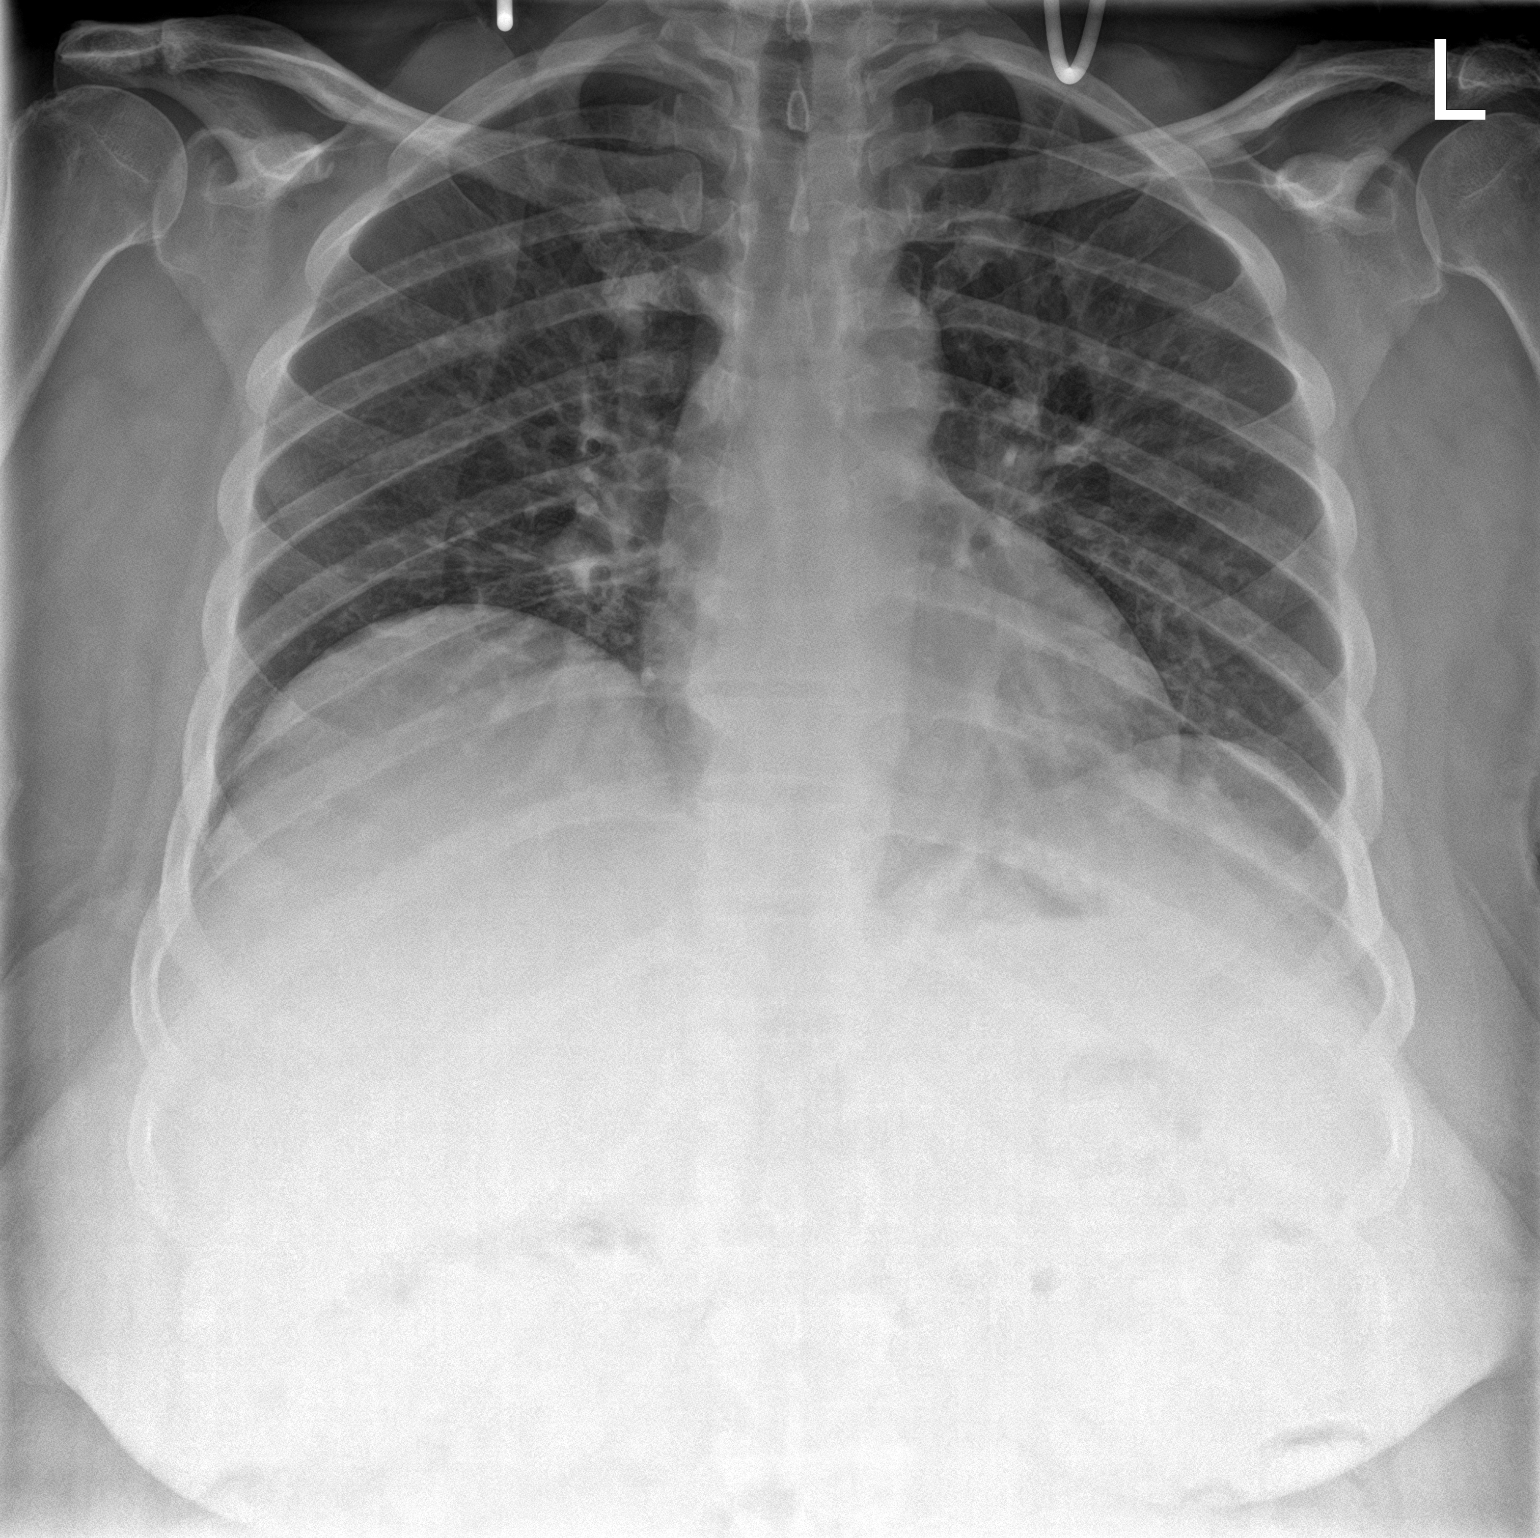

[chest lat]
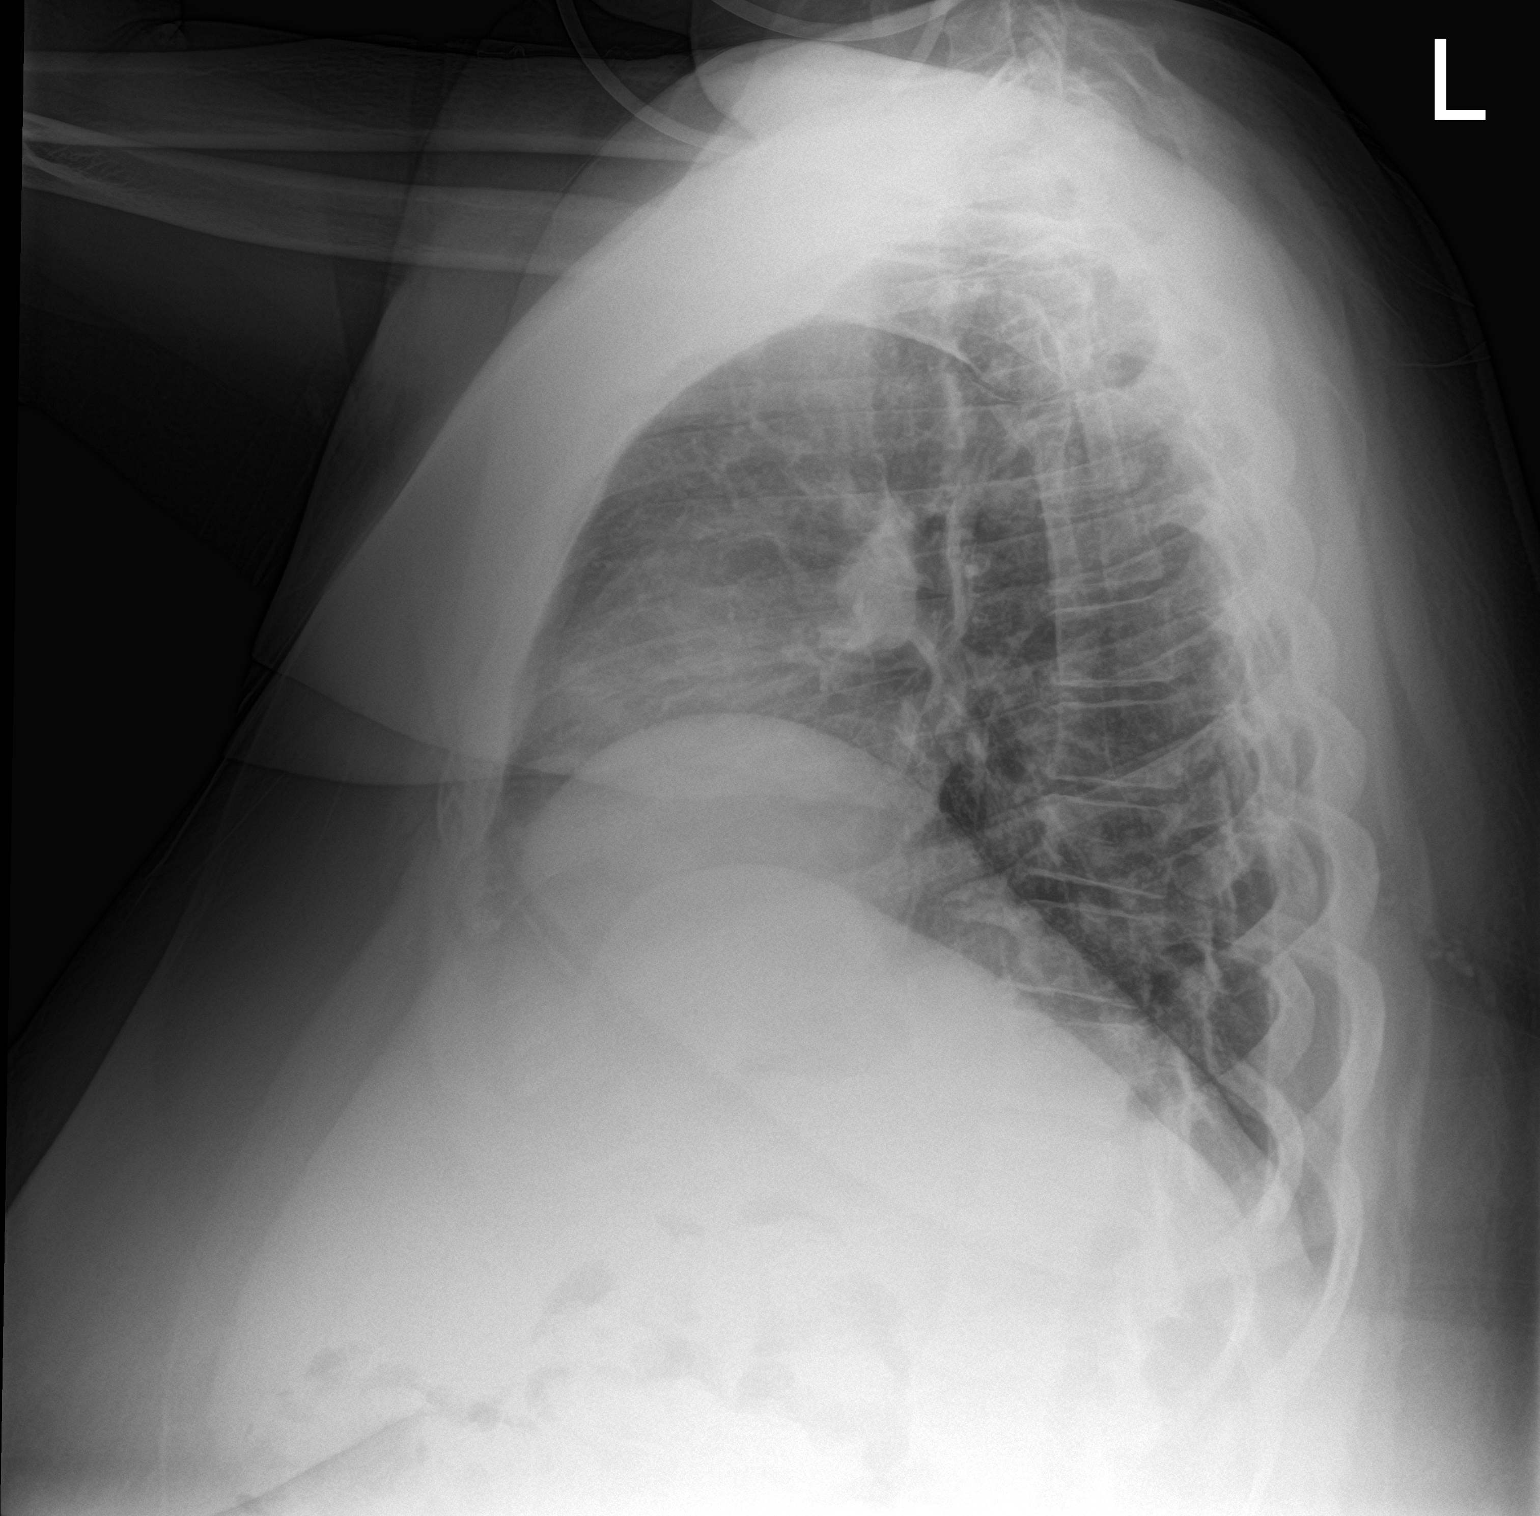

[2 of 2 positions shown; findings below may reference images not displayed]

FINDINGS: Cardiac and mediastinal contours are within normal limits. Low lung
volumes. No focal pulmonary opacity. No pleural effusion or
pneumothorax. No acute osseous abnormality.
IMPRESSION: No acute cardiopulmonary process.

## 2023-03-03 NOTE — Progress Notes (Signed)
   Chief Complaint  Patient presents with   Diabetes    Patient came in today for right foot 3rd toe swelling and redness, started 2 days ago, patient denies any pain, numbness, Diabetic A1c- Unknown BG- Not taking    HPI: 52 y.o. female presenting today for evaluation of preulcerative calluses that developed to the patient's feet approximately 1 week ago.  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  She developed a blister to the right third toe.  She does not have a primary care doctor.  She moved here recently about 2 years ago from [place], [place] and has not established primary care.  She presents for further treatment and evaluation  Past Medical History:  Diagnosis Date   Diabetes mellitus without complication (HCC)     Past Surgical History:  Procedure Laterality Date   CESAREAN SECTION     HERNIA REPAIR      No Known Allergies   RT foot 03/03/2023   Physical Exam: General: The patient is alert and oriented x3 in no acute distress.  Dermatology: Skin is warm, dry and supple bilateral lower extremities. Negative for open lesions or macerations.  Vascular: Palpable pedal pulses bilaterally. Capillary refill within normal limits.  Negative for any significant edema or erythema  Neurological: Diminished via light touch  Musculoskeletal Exam: Severe rigid hammertoe deformity noted to the bilateral midfoot creating excessive pressure on the distal tips of the toes   Assessment: 1.  Preulcerative callus distal tip of the right third toe 2.  Hammertoes bilateral 3.  Diabetes mellitus with peripheral polyneuropathy; uncontrolled 4.  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  She has lived here currently now for about 2 years with no PCP for diabetic management.  Referral ordered to  [facility] to establish primary care -Currently the preulcerative callus to the distal tip of the right third toe appears very stable and dry.  No debridement performed today.  Will simply observe for now -Silicone toe crest pads were provided to alleviate pressure from the distal tips of the toes -Advised against going barefoot.  Recommend good supportive shoes that allow plenty of room in the toebox area -Explained to the patient that I do believe once her diabetes is under better management and control she would benefit from hammertoe corrective surgery to prevent ulcers from developing down the road.  I do believe the benefits of hammertoe correction outweigh the potential risks in order to defer visibly prevent recurrence of ulcers  -Return to clinic 4 weeks.  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 Felecia Shelling, DPM    [address] [place], [place] [address]                Office [phone]  Fax [phone]

## 2023-03-09 NOTE — Telephone Encounter (Signed)
-----   Message from Felecia Shelling, DPM sent at 03/03/2023  9:18 AM EDT ----- Regarding: Referral to primary care Please refer this patient to primary care, [facility], Dr. Cristino Martes. Thanks, Dr. Logan Bores

## 2023-04-11 ENCOUNTER — Ambulatory Visit (INDEPENDENT_AMBULATORY_CARE_PROVIDER_SITE_OTHER): Payer: BC Managed Care – PPO | Admitting: Podiatry

## 2023-04-11 ENCOUNTER — Ambulatory Visit (INDEPENDENT_AMBULATORY_CARE_PROVIDER_SITE_OTHER): Payer: BC Managed Care – PPO

## 2023-04-11 DIAGNOSIS — L97511 Non-pressure chronic ulcer of other part of right foot limited to breakdown of skin: Secondary | ICD-10-CM | POA: Diagnosis not present

## 2023-04-11 DIAGNOSIS — E0843 Diabetes mellitus due to underlying condition with diabetic autonomic (poly)neuropathy: Secondary | ICD-10-CM

## 2023-04-11 DIAGNOSIS — M2041 Other hammer toe(s) (acquired), right foot: Secondary | ICD-10-CM

## 2023-04-11 DIAGNOSIS — L97512 Non-pressure chronic ulcer of other part of right foot with fat layer exposed: Secondary | ICD-10-CM

## 2023-04-11 NOTE — Progress Notes (Signed)
Chief Complaint  Patient presents with   Diabetic Ulcer    Patient came in today for 3rd right toe ulcer, drainage, patient denies  any pain,     HPI: 52 y.o. 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  Patient has lived in the area for about 2 years now and has not established primary care.  Last visit recommended different PCPs and she has been unable to set up an appointment to establish care.  PMHx T2DM.  Currently not monitored.  Presenting for further treatment and evaluation  Past Medical History:  Diagnosis Date   Diabetes mellitus without complication (HCC)     Past Surgical History:  Procedure Laterality Date   CESAREAN SECTION     HERNIA REPAIR      No Known Allergies   RT foot 03/03/2023   RT foot 04/11/2023   Physical Exam: General: The patient is alert and oriented x3 in no acute distress.  Dermatology: Ulcer noted encompassing the distal tip of the right third toe measuring approximately 2.5 x 2.5 x 0.3 cm.  Granular wound base.  After debridement there is no exposed bone muscle tendon ligament or joint.  Serosanguineous drainage.  Maceration noted.  Please see above noted photo  Vascular: Palpable pedal pulses bilaterally. Capillary refill within normal limits.  Negative for any significant edema or erythema  Neurological: Diminished via light touch  Musculoskeletal Exam: Severe rigid hammertoe deformity noted to the bilateral feet creating excessive pressure on the distal tips of the toes  Radiographic exam RT foot 04/11/2023: Normal osseous mineralization.  Joint spaces mostly preserved.  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  No acute fractures identified.  No osseous erosions noted be concerning for osteomyelitis.  Assessment: 1.  Diabetes mellitus with peripheral polyneuropathy; uncontrolled 2.   Ulcer right third toe fat layer exposed 3.  Chronic severe hammertoe deformity digits 2, 3, 4 bilateral feet 4.  PMHx of osteomyelitis bilateral feet  -Patient evaluated.  X-rays reviewed.  Medically necessary excisional debridement including subcutaneous tissue was performed using a tissue nipper.  Excisional debridement of the necrotic nonviable tissue down to healthier bleeding viable tissue was performed with postdebridement measurement same as pre-. -Stressed the importance of establishing primary care doctor to begin managing her diabetes.  She has lived here currently now for about 2 years with no PCP for diabetic management.  Last visit referral was ordered but she has not made an appointment to establish care - Continue silicone toe crest pad to offload pressure from the distal tips of the toes.  Patient states that she does not wear it daily - Postsurgical shoe dispensed.  WBAT -Recommend taking time off of work to allow her foot to heal.  Note provided -Explained again to the patient that I do believe once her diabetes is under better management and control she would benefit from hammertoe corrective surgery to prevent ulcers from developing down the road.  I do believe the benefits of hammertoe correction outweigh the potential risks in order to defer visibly prevent recurrence of ulcers  -Return to clinic 4 weeks.    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 Felecia Shelling, DPM    [address]  [place], [place] [address]                Office [phone]  Fax [phone]

## 2023-04-12 ENCOUNTER — Telehealth: Payer: Self-pay | Admitting: Podiatry

## 2023-04-12 NOTE — Telephone Encounter (Signed)
Destiny Luna stated that you recommended her to stay out work for a while, but after looking at her finances she would like to return to work on 04/18/2023. She said she just can't afford to stay oow. Please advise?Also with her intermittent leave she would like to have it set up for 4 times per month with 3 days per epidode?

## 2023-05-01 ENCOUNTER — Ambulatory Visit (INDEPENDENT_AMBULATORY_CARE_PROVIDER_SITE_OTHER): Payer: BC Managed Care – PPO | Admitting: Nurse Practitioner

## 2023-05-01 ENCOUNTER — Encounter: Payer: Self-pay | Admitting: Nurse Practitioner

## 2023-05-01 ENCOUNTER — Other Ambulatory Visit: Payer: Self-pay

## 2023-05-01 VITALS — BP 124/78 | HR 93 | Temp 97.9°F | Resp 16 | Wt 273.1 lb

## 2023-05-01 DIAGNOSIS — Z794 Long term (current) use of insulin: Secondary | ICD-10-CM

## 2023-05-01 DIAGNOSIS — Z114 Encounter for screening for human immunodeficiency virus [HIV]: Secondary | ICD-10-CM

## 2023-05-01 DIAGNOSIS — Z1211 Encounter for screening for malignant neoplasm of colon: Secondary | ICD-10-CM

## 2023-05-01 DIAGNOSIS — Z1159 Encounter for screening for other viral diseases: Secondary | ICD-10-CM

## 2023-05-01 DIAGNOSIS — Z13220 Encounter for screening for lipoid disorders: Secondary | ICD-10-CM

## 2023-05-01 DIAGNOSIS — Z7689 Persons encountering health services in other specified circumstances: Secondary | ICD-10-CM

## 2023-05-01 DIAGNOSIS — Z1231 Encounter for screening mammogram for malignant neoplasm of breast: Secondary | ICD-10-CM | POA: Diagnosis not present

## 2023-05-01 DIAGNOSIS — Z130 Encounter for screening for diseases of the blood and blood-forming organs and certain disorders involving the immune mechanism: Secondary | ICD-10-CM

## 2023-05-01 DIAGNOSIS — E1159 Type 2 diabetes mellitus with other circulatory complications: Secondary | ICD-10-CM | POA: Diagnosis not present

## 2023-05-01 DIAGNOSIS — E119 Type 2 diabetes mellitus without complications: Secondary | ICD-10-CM | POA: Insufficient documentation

## 2023-05-01 LAB — CBC WITH DIFFERENTIAL/PLATELET
Basophils Absolute: 63 cells/uL (ref 0–200)
Basophils Relative: 1 %
MCHC: 31.6 g/dL — ABNORMAL LOW (ref 32.0–36.0)
MCV: 81.5 fL (ref 80.0–100.0)
Monocytes Relative: 8 %
WBC: 6.3 10*3/uL (ref 3.8–10.8)

## 2023-05-01 MED ORDER — TIRZEPATIDE 2.5 MG/0.5ML ~~LOC~~ SOAJ
2.5000 mg | SUBCUTANEOUS | 0 refills | Status: DC
Start: 2023-05-01 — End: 2023-05-18

## 2023-05-01 NOTE — Progress Notes (Signed)
BP 124/78   Pulse 93   Temp 97.9 F (36.6 C) (Oral)   Resp 16   Wt 273 lb 1.6 oz (123.9 kg)   SpO2 98%   BMI 45.45 kg/m    Subjective:    Patient ID: Destiny Luna, female    DOB: 1971/08/26, 52 y.o.   MRN: [id number]  HPI: Destiny Luna is a 52 y.o. female  Chief Complaint  Patient presents with   Establish Care   Diabetes   Obesity    Discuss gastric sleeve and weight loss medications   Establish care: her last physical was years ago.  Medical history includes diabetes, obesity.  Family history includes diabetes, cad, stroke.  Health maintenance due for labs, mammogram, pap, colon cancer screening.   Diabetes: her last A1C was 9.9 on 01/30/2019.  She is currently on insulin that she is getting over the counter at [facility].  Novolin 35 units daily.  She says last night she did 40 units. She reports her blood sugar has been running 180. Currently has a toe ulcer on her third right toe. She is being seen by podiatry.  She does have neuropathy. Last seen on 04/11/2023. Discussed starting mounjaro and working on coming off of insulin if possible. She is agreeable to plan. Will work on pa for [employer] and gave patient instructions for sliding scale when she starts mounjaro.  After starting mounjaro: Pre-meal insulin :  Check fsbs prior to meals  Hold if below 110 If glucose 110- 140 give 4 units If glucose 140 to 180 give 6 units  If glucose 180 to 220  give 8 units If glucose above 220 give 10 units   Obesity: her weight today is 273 lbs with a BMI of 45.45. she would like help with weight loss. Discussed starting mounjaro.  Discussed side effects and administration.   Relevant past medical, surgical, family and social history reviewed and updated as indicated. Interim medical history since our last visit reviewed. Allergies and medications reviewed and updated.  Review of Systems  Constitutional: Negative for fever or weight change.  Respiratory: Negative for cough and  shortness of breath.   Cardiovascular: Negative for chest pain or palpitations.  Gastrointestinal: Negative for abdominal pain, no bowel changes.  Musculoskeletal: Negative for gait problem or joint swelling.  Skin: Negative for rash.  Neurological: Negative for dizziness or headache.  No other specific complaints in a complete review of systems (except as listed in HPI above).      Objective:    BP 124/78   Pulse 93   Temp 97.9 F (36.6 C) (Oral)   Resp 16   Wt 273 lb 1.6 oz (123.9 kg)   SpO2 98%   BMI 45.45 kg/m   Wt Readings from Last 3 Encounters:  05/01/23 273 lb 1.6 oz (123.9 kg)  05/13/22 250 lb (113.4 kg)  11/03/21 260 lb (117.9 kg)    Physical Exam  Constitutional: Patient appears well-developed and well-nourished. Obese  No distress.  HEENT: head atraumatic, normocephalic, pupils equal and reactive to light, neck supple, throat within normal limits Cardiovascular: Normal rate, regular rhythm and normal heart sounds.  No murmur heard. No BLE edema. Pulmonary/Chest: Effort normal and breath sounds normal. No respiratory distress. Abdominal: Soft.  There is no tenderness. Psychiatric: Patient has a normal mood and affect. behavior is normal. Judgment and thought content normal.      Assessment & Plan:   Problem List Items Addressed This Visit  Endocrine   Diabetes mellitus (HCC) - Primary    Start mounjaro, then change insulin dosing to sliding scale Pre-meal insulin :  Check fsbs prior to meals  Hold if below 110 If glucose 110- 140 give 4 units If glucose 140 to 180 give 6 units  If glucose 180 to 220  give 8 units If glucose above 220 give 10 units       Relevant Medications   tirzepatide (MOUNJARO) 2.5 MG/0.5ML Pen   Other Relevant Orders   Microalbumin / creatinine urine ratio   COMPLETE METABOLIC PANEL WITH GFR   Hemoglobin A1c   Ambulatory referral to Optometry     Other   Morbid obesity (HCC)    Work on lifestyle modification and start  mounjaro if approved.       Relevant Medications   tirzepatide (MOUNJARO) 2.5 MG/0.5ML Pen   Other Visit Diagnoses     Encounter to establish care       schedule cpe   Encounter for screening mammogram for malignant neoplasm of breast       Relevant Orders   MM 3D SCREENING MAMMOGRAM BILATERAL BREAST   Screening for colon cancer       Relevant Orders   Ambulatory referral to Gastroenterology   Screening for HIV without presence of risk factors       Relevant Orders   HIV Antibody (routine testing w rflx)   Encounter for hepatitis C screening test for low risk patient       Relevant Orders   Hepatitis C antibody   Screening for cholesterol level       Relevant Orders   Lipid panel   Screening for deficiency anemia       Relevant Orders   CBC with Differential/Platelet        Follow up plan: Return in about 4 weeks (around 05/29/2023) for follow up.

## 2023-05-01 NOTE — Assessment & Plan Note (Signed)
Work on lifestyle modification and start mounjaro if approved.

## 2023-05-01 NOTE — Patient Instructions (Signed)
Pre-meal insulin :  Check fsbs prior to meals  Hold if below 110 If glucose 110- 140 give 4 units If glucose 140 to 180 give 6 units  If glucose 180 to 220  give 8 units If glucose above 220 give 10 units 

## 2023-05-01 NOTE — Assessment & Plan Note (Signed)
Start mounjaro, then change insulin dosing to sliding scale Pre-meal insulin :  Check fsbs prior to meals  Hold if below 110 If glucose 110- 140 give 4 units If glucose 140 to 180 give 6 units  If glucose 180 to 220  give 8 units If glucose above 220 give 10 units

## 2023-05-02 ENCOUNTER — Ambulatory Visit: Payer: BC Managed Care – PPO | Admitting: Podiatry

## 2023-05-02 ENCOUNTER — Telehealth: Payer: Self-pay

## 2023-05-02 ENCOUNTER — Other Ambulatory Visit: Payer: Self-pay | Admitting: Nurse Practitioner

## 2023-05-02 LAB — CBC WITH DIFFERENTIAL/PLATELET
Absolute Monocytes: 504 cells/uL (ref 200–950)
Eosinophils Absolute: 120 cells/uL (ref 15–500)
Eosinophils Relative: 1.9 %
HCT: 40.5 % (ref 35.0–45.0)
Hemoglobin: 12.8 g/dL (ref 11.7–15.5)
Lymphs Abs: 3238 cells/uL (ref 850–3900)
MCH: 25.8 pg — ABNORMAL LOW (ref 27.0–33.0)
MPV: 10.6 fL (ref 7.5–12.5)
Neutro Abs: 2375 cells/uL (ref 1500–7800)
Neutrophils Relative %: 37.7 %
Platelets: 311 10*3/uL (ref 140–400)
RBC: 4.97 10*6/uL (ref 3.80–5.10)
RDW: 14 % (ref 11.0–15.0)
Total Lymphocyte: 51.4 %

## 2023-05-02 LAB — COMPLETE METABOLIC PANEL WITH GFR
AG Ratio: 1.2 (calc) (ref 1.0–2.5)
ALT: 10 U/L (ref 6–29)
AST: 10 U/L (ref 10–35)
Albumin: 3.8 g/dL (ref 3.6–5.1)
Alkaline phosphatase (APISO): 109 U/L (ref 37–153)
BUN: 19 mg/dL (ref 7–25)
CO2: 28 mmol/L (ref 20–32)
Calcium: 9.1 mg/dL (ref 8.6–10.4)
Chloride: 104 mmol/L (ref 98–110)
Creat: 0.91 mg/dL (ref 0.50–1.03)
Globulin: 3.1 g/dL (calc) (ref 1.9–3.7)
Glucose, Bld: 260 mg/dL — ABNORMAL HIGH (ref 65–99)
Potassium: 4.5 mmol/L (ref 3.5–5.3)
Sodium: 142 mmol/L (ref 135–146)
Total Bilirubin: 0.3 mg/dL (ref 0.2–1.2)
Total Protein: 6.9 g/dL (ref 6.1–8.1)
eGFR: 76 mL/min/{1.73_m2} (ref >=60)

## 2023-05-02 LAB — HEMOGLOBIN A1C
Hgb A1c MFr Bld: 12.8 % of total Hgb — ABNORMAL HIGH (ref <5.7)
Mean Plasma Glucose: 321 mg/dL
eAG (mmol/L): 17.8 mmol/L

## 2023-05-02 LAB — LIPID PANEL
Cholesterol: 197 mg/dL (ref <200)
HDL: 46 mg/dL — ABNORMAL LOW (ref >=50)
LDL Cholesterol (Calc): 112 mg/dL (calc) — ABNORMAL HIGH
Non-HDL Cholesterol (Calc): 151 mg/dL (calc) — ABNORMAL HIGH (ref <130)
Total CHOL/HDL Ratio: 4.3 (calc) (ref <5.0)
Triglycerides: 271 mg/dL — ABNORMAL HIGH (ref <150)

## 2023-05-02 LAB — MICROALBUMIN / CREATININE URINE RATIO
Creatinine, Urine: 96 mg/dL (ref 20–275)
Microalb Creat Ratio: 1418 mg/g creat — ABNORMAL HIGH (ref <30)
Microalb, Ur: 136.1 mg/dL

## 2023-05-02 LAB — HIV ANTIBODY (ROUTINE TESTING W REFLEX): HIV 1&2 Ab, 4th Generation: NONREACTIVE

## 2023-05-02 LAB — HEPATITIS C ANTIBODY: Hepatitis C Ab: NONREACTIVE

## 2023-05-02 NOTE — Telephone Encounter (Signed)
Contacted patient to schedule her colonoscopy.  She requested a call back later on today to discuss her colonoscopy referral.  She is experiencing some GI issues, but was on the way to her daughter's graduation.  Thanks, [place], [place]

## 2023-05-03 ENCOUNTER — Telehealth: Payer: Self-pay | Admitting: Emergency Medicine

## 2023-05-03 ENCOUNTER — Other Ambulatory Visit: Payer: Self-pay | Admitting: Nurse Practitioner

## 2023-05-03 DIAGNOSIS — R809 Proteinuria, unspecified: Secondary | ICD-10-CM

## 2023-05-03 DIAGNOSIS — Z794 Long term (current) use of insulin: Secondary | ICD-10-CM

## 2023-05-03 MED ORDER — LOSARTAN POTASSIUM 25 MG PO TABS
25.0000 mg | ORAL_TABLET | Freq: Every day | ORAL | 1 refills | Status: DC
Start: 2023-05-03 — End: 2024-04-03

## 2023-05-03 MED ORDER — INSULIN PEN NEEDLE 32G X 6 MM MISC
1.0000 | Freq: Every day | 3 refills | Status: DC
Start: 2023-05-03 — End: 2024-01-26

## 2023-05-03 NOTE — Telephone Encounter (Signed)
Patient stated that she is having digestive issues.  She said she is either all the way constipated or all diarrhea.  She ate grits and it gave her diarrhea.  Watermelon gives her diarrhea. Just having a lot of issues with bowel regularity.  She said she knows she needs a colonoscopy as well.  She is also thinking about having gastric sleeve procedure.  I informed her that I will have someone from the office to call to schedule her an office visit to see Inetta Fermo to discuss her bowel habit issues and then we can schedule her colonoscopy during her office visit.  Thanks, [place]

## 2023-05-03 NOTE — Telephone Encounter (Signed)
Patient would like to discuss the [facility] and her Novolin insulin with you> BS running between 200 and 300 and she is taking 30 units  Raynelle Fanning spoke to patient

## 2023-05-05 ENCOUNTER — Ambulatory Visit (INDEPENDENT_AMBULATORY_CARE_PROVIDER_SITE_OTHER): Payer: BC Managed Care – PPO | Admitting: Podiatry

## 2023-05-05 ENCOUNTER — Encounter: Payer: Self-pay | Admitting: Podiatry

## 2023-05-05 DIAGNOSIS — L97512 Non-pressure chronic ulcer of other part of right foot with fat layer exposed: Secondary | ICD-10-CM | POA: Diagnosis not present

## 2023-05-05 DIAGNOSIS — E119 Type 2 diabetes mellitus without complications: Secondary | ICD-10-CM

## 2023-05-05 DIAGNOSIS — E0843 Diabetes mellitus due to underlying condition with diabetic autonomic (poly)neuropathy: Secondary | ICD-10-CM | POA: Diagnosis not present

## 2023-05-05 NOTE — Addendum Note (Signed)
Addended by: Felecia Shelling on: 05/05/2023 09:32 AM   Modules accepted: Level of Service

## 2023-05-05 NOTE — Progress Notes (Addendum)
Chief Complaint  Patient presents with   Foot Ulcer    3 week follow up right foot    HPI: 52 y.o. female presenting today for follow-up evaluation of an ulcer to the right second digit.  Patient has noted significant improvement.  She was seen by [facility] on 05/01/2023 and establish care with them.  She is very satisfied with [facility], appreciate their care.  A1c on 05/01/2023 was 12.8.  Started on [place] and manage now with Della Goo FNP at [facility].  Greatly appreciated.  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  Patient has lived in the area for about 2 years now and has not established primary care.  Last visit recommended different PCPs and she has been unable to set up an appointment to establish care.  PMHx T2DM.  Currently not monitored.  Presenting for further treatment and evaluation  Past Medical History:  Diagnosis Date   Diabetes mellitus without complication (HCC)     Past Surgical History:  Procedure Laterality Date   CESAREAN SECTION     HERNIA REPAIR      No Known Allergies   RT foot 03/03/2023   RT foot 04/11/2023   Physical Exam: General: The patient is alert and oriented x3 in no acute distress.  Dermatology: Significant improvement of the right third toe.  The majority of the ulcerative lesion has healed.  There is a remaining small ulcer to the distal tip of the toe now.  Overall great improvement.  No clinical indication of infection.  Chronic edema noted to the toe but overall it appears very stable.  No erythema that would be concerning for cellulitis.  Vascular: Clinically no concern for vascular compromise  Neurological: Diminished via light touch  Musculoskeletal Exam: Severe rigid hammertoe deformity noted to the bilateral feet creating excessive pressure on the distal tips of the toes  Radiographic exam RT foot 04/11/2023: Normal osseous mineralization.   Joint spaces mostly preserved.  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  No acute fractures identified.  No osseous erosions noted be concerning for osteomyelitis.  Assessment: 1.  Diabetes mellitus with peripheral polyneuropathy; uncontrolled 2.  Ulcer right third toe fat layer exposed 3.  Chronic severe hammertoe deformity digits 2, 3, 4 bilateral feet 4.  PMHx of osteomyelitis bilateral feet  -Patient evaluated.  Comprehensive diabetic foot exam performed today  -Medically necessary excisional debridement including subcutaneous tissue was performed using a tissue nipper.  Excisional debridement of the necrotic nonviable tissue down to healthier bleeding viable tissue was performed with postdebridement measurement same as pre-. - Continue silicone toe crest pad to offload pressure from the distal tip of the toe. -Continue medication and primary care management with [facility].  Greatly appreciated!  -Note for work was provided: Refrain from work up to 4 episodes per month for 3 days per episode.  As needed. 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 Felecia Shelling, DPM    [address], [place] [address]  Office [phone]  Fax [phone]

## 2023-05-08 ENCOUNTER — Ambulatory Visit: Payer: Self-pay

## 2023-05-08 NOTE — Telephone Encounter (Signed)
  Chief Complaint: Higher than usual blood sugar readings. Some as high as 300. Symptoms: none Frequency: since starting sample of new insulin Pertinent Negatives: Patient denies s/s Disposition: [] ED /[] Urgent Care (no appt availability in office) / [x] Appointment(In office/virtual)/ []  [facility] [place] [facility]/ [] Home Care/ [] Refused Recommended Disposition /[] [place] [facility]/ []  Follow-up with PCP Additional Notes: Pt states that since starting the new insulin blood sugars have been higher than they used to be. Pt made appt for tomorrow and will bring bs readings. Pt was given a sample to try.  Summary: medication question   Pt called saying the new insulin that she is taking is not working.  Please call back after 12:00.  She said it is not keeping her numbers down  CB#  [phone]     Reason for Disposition  [1] Blood glucose > 300 mg/dL (09.8 mmol/L) AND [1] uses insulin (e.g., insulin-dependent, all people with type 1 diabetes)  Answer Assessment - Initial Assessment Questions 1. BLOOD GLUCOSE: "What is your blood glucose level?"      As high as 300's 2. ONSET: "When did you check the blood glucose?"     Ongoing 3. USUAL RANGE: "What is your glucose level usually?" (e.g., usual fasting morning value, usual evening value)     100-200  5. TYPE 1 or 2:  "Do you know what type of diabetes you have?"  (e.g., Type 1, Type 2, Gestational; doesn't know)      2 6. INSULIN: "Do you take insulin?" "What type of insulin(s) do you use? What is the mode of delivery? (syringe, pen; injection or pump)?"      yes 8. OTHER SYMPTOMS: "Do you have any symptoms?" (e.g., fever, frequent urination, difficulty breathing, dizziness, weakness, vomiting)     no  Protocols used: Diabetes - High Blood Sugar-A-AH

## 2023-05-08 NOTE — Progress Notes (Deleted)
There were no vitals taken for this visit.   Subjective:    Patient ID: Destiny Luna, female    DOB: May 15, 1971, 52 y.o.   MRN: [id number]  HPI: Destiny Luna is a 52 y.o. female  No chief complaint on file.  Diabetes, Type 2:  -Last A1c 12.8 -Medications: mounjaro 2.5 mg weekly, toujeo 20 units daily -Patient is compliant with the above medications and reports no side effects. *** -Checking BG at home: *** -Fasting home BG: *** -Post-prandial home BG: *** -Highest home BG since last visit: *** -Lowest home BG since last visit: *** -Diet: *** -Exercise: *** -Eye exam: *** -Foot exam: *** -Microalbumin: *** -Statin: *** -PNA vaccine: *** -Denies symptoms of hypoglycemia, polyuria, polydipsia, numbness extremities, currently being treated foot ulcers by podiatry   Relevant past medical, surgical, family and social history reviewed and updated as indicated. Interim medical history since our last visit reviewed. Allergies and medications reviewed and updated.  Review of Systems  Constitutional: Negative for fever or weight change.  Respiratory: Negative for cough and shortness of breath.   Cardiovascular: Negative for chest pain or palpitations.  Gastrointestinal: Negative for abdominal pain, no bowel changes.  Musculoskeletal: Negative for gait problem or joint swelling.  Skin: Negative for rash.  Neurological: Negative for dizziness or headache.  No other specific complaints in a complete review of systems (except as listed in HPI above).      Objective:    There were no vitals taken for this visit.  Wt Readings from Last 3 Encounters:  05/01/23 273 lb 1.6 oz (123.9 kg)  05/13/22 250 lb (113.4 kg)  11/03/21 260 lb (117.9 kg)    Physical Exam  Constitutional: Patient appears well-developed and well-nourished. Obese *** No distress.  HEENT: head atraumatic, normocephalic, pupils equal and reactive to light, ears ***, neck supple, throat within normal  limits Cardiovascular: Normal rate, regular rhythm and normal heart sounds.  No murmur heard. No BLE edema. Pulmonary/Chest: Effort normal and breath sounds normal. No respiratory distress. Abdominal: Soft.  There is no tenderness. Psychiatric: Patient has a normal mood and affect. behavior is normal. Judgment and thought content normal.   Results for orders placed or performed in visit on 05/01/23  Lipid panel  Result Value Ref Range   Cholesterol 197 <200 mg/dL   HDL 46 (L) > OR = 50 mg/dL   Triglycerides 629 (H) <150 mg/dL   LDL Cholesterol (Calc) 112 (H) mg/dL (calc)   Total CHOL/HDL Ratio 4.3 <5.0 (calc)   Non-HDL Cholesterol (Calc) 151 (H) <130 mg/dL (calc)  Microalbumin / creatinine urine ratio  Result Value Ref Range   Creatinine, Urine 96 20 - 275 mg/dL   Microalb, Ur 528.4 mg/dL   Microalb Creat Ratio 1,418 (H) <30 mg/g creat  CBC with Differential/Platelet  Result Value Ref Range   WBC 6.3 3.8 - 10.8 Thousand/uL   RBC 4.97 3.80 - 5.10 Million/uL   Hemoglobin 12.8 11.7 - 15.5 g/dL   HCT 13.2 44.0 - 10.2 %   MCV 81.5 80.0 - 100.0 fL   MCH 25.8 (L) 27.0 - 33.0 pg   MCHC 31.6 (L) 32.0 - 36.0 g/dL   RDW 72.5 36.6 - 44.0 %   Platelets 311 140 - 400 Thousand/uL   MPV 10.6 7.5 - 12.5 fL   Neutro Abs 2,375 1,500 - 7,800 cells/uL   Lymphs Abs 3,238 850 - 3,900 cells/uL   Absolute Monocytes 504 200 - 950 cells/uL   Eosinophils Absolute 120 15 -  500 cells/uL   Basophils Absolute 63 0 - 200 cells/uL   Neutrophils Relative % 37.7 %   Total Lymphocyte 51.4 %   Monocytes Relative 8.0 %   Eosinophils Relative 1.9 %   Basophils Relative 1.0 %  COMPLETE METABOLIC PANEL WITH GFR  Result Value Ref Range   Glucose, Bld 260 (H) 65 - 99 mg/dL   BUN 19 7 - 25 mg/dL   Creat 4.09 8.11 - 9.14 mg/dL   eGFR 76 > OR = 60 NW/GNF/6.21H0   BUN/Creatinine Ratio SEE NOTE: 6 - 22 (calc)   Sodium 142 135 - 146 mmol/L   Potassium 4.5 3.5 - 5.3 mmol/L   Chloride 104 98 - 110 mmol/L   CO2 28 20  - 32 mmol/L   Calcium 9.1 8.6 - 10.4 mg/dL   Total Protein 6.9 6.1 - 8.1 g/dL   Albumin 3.8 3.6 - 5.1 g/dL   Globulin 3.1 1.9 - 3.7 g/dL (calc)   AG Ratio 1.2 1.0 - 2.5 (calc)   Total Bilirubin 0.3 0.2 - 1.2 mg/dL   Alkaline phosphatase (APISO) 109 37 - 153 U/L   AST 10 10 - 35 U/L   ALT 10 6 - 29 U/L  Hemoglobin A1c  Result Value Ref Range   Hgb A1c MFr Bld 12.8 (H) <5.7 % of total Hgb   Mean Plasma Glucose 321 mg/dL   eAG (mmol/L) 86.5 mmol/L  Hepatitis C antibody  Result Value Ref Range   Hepatitis C Ab NON-REACTIVE NON-REACTIVE  HIV Antibody (routine testing w rflx)  Result Value Ref Range   HIV 1&2 Ab, 4th Generation NON-REACTIVE NON-REACTIVE      Assessment & Plan:   Problem List Items Addressed This Visit   None    Follow up plan: No follow-ups on file.

## 2023-05-09 ENCOUNTER — Ambulatory Visit: Payer: BC Managed Care – PPO | Admitting: Nurse Practitioner

## 2023-05-09 DIAGNOSIS — E1159 Type 2 diabetes mellitus with other circulatory complications: Secondary | ICD-10-CM

## 2023-05-12 ENCOUNTER — Encounter: Payer: Self-pay | Admitting: *Deleted

## 2023-05-18 ENCOUNTER — Other Ambulatory Visit: Payer: Self-pay

## 2023-05-18 ENCOUNTER — Other Ambulatory Visit: Payer: Self-pay | Admitting: Internal Medicine

## 2023-05-18 DIAGNOSIS — E1159 Type 2 diabetes mellitus with other circulatory complications: Secondary | ICD-10-CM

## 2023-05-18 MED ORDER — TIRZEPATIDE 2.5 MG/0.5ML ~~LOC~~ SOAJ
2.5000 mg | SUBCUTANEOUS | 0 refills | Status: DC
Start: 2023-05-18 — End: 2023-05-22

## 2023-05-18 MED ORDER — INSULIN DETEMIR 100 UNIT/ML ~~LOC~~ SOLN
20.0000 [IU] | Freq: Every day | SUBCUTANEOUS | 0 refills | Status: DC
Start: 2023-05-18 — End: 2023-05-19

## 2023-05-18 NOTE — Telephone Encounter (Unsigned)
Copied from CRM [id number]. Topic: General - Inquiry >> May 18, 2023  8:37 AM Pincus Sanes wrote: Reason for CRM: Call Pt / Pt had been given samples of insulin and she is almost out and she did not understand why she did not get a script. She seemed unhappy with PCP and wanted a dr instead, after speaking with her she seems fine but states unsureness of being a diabetic and now that samples have ran out got agitated and states was not told what to do. Pls fu with pt [phone]

## 2023-05-18 NOTE — Telephone Encounter (Signed)
Pt is in fact taking sample of toujeo 30 units

## 2023-05-19 ENCOUNTER — Other Ambulatory Visit: Payer: Self-pay | Admitting: Internal Medicine

## 2023-05-19 DIAGNOSIS — Z794 Long term (current) use of insulin: Secondary | ICD-10-CM

## 2023-05-19 MED ORDER — TOUJEO MAX SOLOSTAR 300 UNIT/ML ~~LOC~~ SOPN
30.0000 [IU] | PEN_INJECTOR | Freq: Every day | SUBCUTANEOUS | 0 refills | Status: DC
Start: 2023-05-19 — End: 2023-07-12

## 2023-05-22 ENCOUNTER — Telehealth: Payer: Self-pay | Admitting: Nurse Practitioner

## 2023-05-22 ENCOUNTER — Other Ambulatory Visit: Payer: Self-pay

## 2023-05-22 NOTE — Telephone Encounter (Signed)
Pt is calling to report that she takes her last shot for tirzepatide [facility]) 2.5 MG/0.5ML Pen [[id number]] Patient is requesting the dosage to be increased. Pt reports that Raynelle Fanning was not in the office last week. And the same dosage was sent. Preferred Pharmacy- [facility] [address] [place], [place] - [address] Phone: [phone]  Fax: [phone]  Please advise with patient [phone]

## 2023-05-23 MED ORDER — TIRZEPATIDE 5 MG/0.5ML ~~LOC~~ SOAJ: 5.0000 mg | SUBCUTANEOUS | 0 refills | Status: DC

## 2023-06-04 NOTE — Progress Notes (Deleted)
Celso Amy, PA-C [address]  [address]  [place], [place] [address]  Main: [phone]  Fax: [phone]   Gastroenterology Consultation  Referring Provider:     Berniece Salines, FNP Primary Care Physician:  Berniece Salines, FNP Primary Gastroenterologist:  *** Reason for Consultation:     Change in bowel habits; colon cancer screening        HPI:   Destiny Luna is a 52 y.o. y/o female referred for consultation & management  by Berniece Salines, FNP.    She is due for her first screening colonoscopy.  She has never had a colonoscopy.  Previous history of gastric sleeve procedure.  Struggles with morbid obesity, BMI 45.  Has episodes of diarrhea alternating with constipation.  GI symptoms  Labs 05/01/2023: CBC, CMP normal except moderately elevated glucose 260.  Hemoglobin A1c 12.8.  Negative HCV and HIV.  Normal hemoglobin 12.8, no anemia.  Normal LFTs.  Past Medical History:  Diagnosis Date   Diabetes mellitus without complication (HCC)     Past Surgical History:  Procedure Laterality Date   CESAREAN SECTION     HERNIA REPAIR      Prior to Admission medications   Medication Sig Start Date End Date Taking? Authorizing Provider  insulin glargine, 2 Unit Dial, (TOUJEO MAX SOLOSTAR) 300 UNIT/ML Solostar Pen Inject 30 Units into the skin daily. 05/19/23   Margarita Mail, DO  Insulin Pen Needle 32G X 6 MM MISC 1 each by Does not apply route daily. 05/03/23   Berniece Salines, FNP  losartan (COZAAR) 25 MG tablet Take 1 tablet (25 mg total) by mouth daily. 05/03/23   Berniece Salines, FNP  tirzepatide [facility] [place]) 5 MG/0.5ML Pen Inject 5 mg into the skin once a week. 05/23/23   Berniece Salines, FNP  fluticasone (FLONASE) 50 MCG/ACT nasal spray Place 2 sprays into both nostrils daily. 03/27/20 09/26/20  Domenick Gong, MD  levocetirizine (XYZAL) 5 MG tablet Take 1 tablet (5 mg total) by mouth every evening. 03/27/20 09/26/20  Domenick Gong, MD    Family History   Problem Relation Age of Onset   Diabetes Mother    CAD Father        CABG x 3   Stroke Father    Diabetes Father      Social History   Tobacco Use   Smoking status: Former    Packs/day: 0.25    Years: 4.00    Additional pack years: 0.00    Total pack years: 1.00    Types: Cigarettes   Smokeless tobacco: Never   Tobacco comments:    quit for 8 years and restarted  Vaping Use   Vaping Use: Never used  Substance Use Topics   Alcohol use: Yes    Comment: rarely   Drug use: Never    Allergies as of 06/05/2023   (No Known Allergies)    Review of Systems:    All systems reviewed and negative except where noted in HPI.   Physical Exam:  There were no vitals taken for this visit. No LMP recorded. Patient is perimenopausal. Psych:  Alert and cooperative. Normal mood and affect. General:   Alert,  Well-developed, well-nourished, pleasant and cooperative in NAD Head:  Normocephalic and atraumatic. Eyes:  Sclera clear, no icterus.   Conjunctiva pink. Neck:  Supple; no masses or thyromegaly. Lungs:  Respirations even and unlabored.  Clear throughout to auscultation.   No wheezes, crackles, or rhonchi. No acute distress.  Heart:  Regular rate and rhythm; no murmurs, clicks, rubs, or gallops. Abdomen:  Normal bowel sounds.  No bruits.  Soft, and non-distended without masses, hepatosplenomegaly or hernias noted.  No Tenderness.  No guarding or rebound tenderness.    Neurologic:  Alert and oriented x3;  grossly normal neurologically. Psych:  Alert and cooperative. Normal mood and affect.  Imaging Studies: No results found.  Assessment and Plan:   Destiny Luna is a 52 y.o. y/o female has been referred for   1.  Irregular bowel habits: Diarrhea alternating constipation  Start Benefiber  2.  History of gastric sleeve surgery  3.  Colon cancer screening  4.  Uncontrolled diabetes  Postpone screening colonoscopy until diabetes is under better control.  5.  Morbid  obesity  Follow up in 3 Months with TG.  Celso Amy, PA-C    BP check ***

## 2023-06-05 ENCOUNTER — Ambulatory Visit: Payer: BC Managed Care – PPO | Admitting: Physician Assistant

## 2023-06-05 NOTE — Progress Notes (Unsigned)
 There were no vitals taken for this visit.   Subjective:    Patient ID: Destiny Luna, female    DOB: 02/28/1971, 52 y.o.   MRN: [id number]  HPI: [name] is a 52 y.o. female  No chief complaint on file.  Diabetes, Type 2:  -Last A1c 12.8 -Medications: mounjaro 2.5 mg weekly, toujeo 20 units daily -Patient is compliant with the above medications and reports no side effects. *** -Checking BG at home: *** -Fasting home BG: *** -Post-prandial home BG: *** -Highest home BG since last visit: *** -Lowest home BG since last visit: *** -Diet: *** -Exercise: *** -Eye exam: *** -Foot exam: *** -Microalbumin: *** -Statin: *** -PNA vaccine: *** -Denies symptoms of hypoglycemia, polyuria, polydipsia, numbness extremities, currently being treated foot ulcers by podiatry   Relevant past medical, surgical, family and social history reviewed and updated as indicated. Interim medical history since our last visit reviewed. Allergies and medications reviewed and updated.  Review of Systems  Constitutional: Negative for fever or weight change.  Respiratory: Negative for cough and shortness of breath.   Cardiovascular: Negative for chest pain or palpitations.  Gastrointestinal: Negative for abdominal pain, no bowel changes.  Musculoskeletal: Negative for gait problem or joint swelling.  Skin: Negative for rash.  Neurological: Negative for dizziness or headache.  No other specific complaints in a complete review of systems (except as listed in HPI above).      Objective:    There were no vitals taken for this visit.  Wt Readings from Last 3 Encounters:  05/01/23 273 lb 1.6 oz (123.9 kg)  05/13/22 250 lb (113.4 kg)  11/03/21 260 lb (117.9 kg)    Physical Exam  Constitutional: Patient appears well-developed and well-nourished. Obese *** No distress.  HEENT: head atraumatic, normocephalic, pupils equal and reactive to light, ears ***, neck supple, throat within normal  limits Cardiovascular: Normal rate, regular rhythm and normal heart sounds.  No murmur heard. No BLE edema. Pulmonary/Chest: Effort normal and breath sounds normal. No respiratory distress. Abdominal: Soft.  There is no tenderness. Psychiatric: Patient has a normal mood and affect. behavior is normal. Judgment and thought content normal.   Results for orders placed or performed in visit on 05/01/23  Lipid panel  Result Value Ref Range   Cholesterol 197 <200 mg/dL   HDL 46 (L) > OR = 50 mg/dL   Triglycerides 271 (H) <150 mg/dL   LDL Cholesterol (Calc) 112 (H) mg/dL (calc)   Total CHOL/HDL Ratio 4.3 <5.0 (calc)   Non-HDL Cholesterol (Calc) 151 (H) <130 mg/dL (calc)  Microalbumin / creatinine urine ratio  Result Value Ref Range   Creatinine, Urine 96 20 - 275 mg/dL   Microalb, Ur 136.1 mg/dL   Microalb Creat Ratio 1,418 (H) <30 mg/g creat  CBC with Differential/Platelet  Result Value Ref Range   WBC 6.3 3.8 - 10.8 Thousand/uL   RBC 4.97 3.80 - 5.10 Million/uL   Hemoglobin 12.8 11.7 - 15.5 g/dL   HCT 40.5 35.0 - 45.0 %   MCV 81.5 80.0 - 100.0 fL   MCH 25.8 (L) 27.0 - 33.0 pg   MCHC 31.6 (L) 32.0 - 36.0 g/dL   RDW 14.0 11.0 - 15.0 %   Platelets 311 140 - 400 Thousand/uL   MPV 10.6 7.5 - 12.5 fL   Neutro Abs 2,375 1,500 - 7,800 cells/uL   Lymphs Abs 3,238 850 - 3,900 cells/uL   Absolute Monocytes 504 200 - 950 cells/uL   Eosinophils Absolute 120 15 -   500 cells/uL   Basophils Absolute 63 0 - 200 cells/uL   Neutrophils Relative % 37.7 %   Total Lymphocyte 51.4 %   Monocytes Relative 8.0 %   Eosinophils Relative 1.9 %   Basophils Relative 1.0 %  COMPLETE METABOLIC PANEL WITH GFR  Result Value Ref Range   Glucose, Bld 260 (H) 65 - 99 mg/dL   BUN 19 7 - 25 mg/dL   Creat 0.91 0.50 - 1.03 mg/dL   eGFR 76 > OR = 60 mL/min/1.73m2   BUN/Creatinine Ratio SEE NOTE: 6 - 22 (calc)   Sodium 142 135 - 146 mmol/L   Potassium 4.5 3.5 - 5.3 mmol/L   Chloride 104 98 - 110 mmol/L   CO2 28 20  - 32 mmol/L   Calcium 9.1 8.6 - 10.4 mg/dL   Total Protein 6.9 6.1 - 8.1 g/dL   Albumin 3.8 3.6 - 5.1 g/dL   Globulin 3.1 1.9 - 3.7 g/dL (calc)   AG Ratio 1.2 1.0 - 2.5 (calc)   Total Bilirubin 0.3 0.2 - 1.2 mg/dL   Alkaline phosphatase (APISO) 109 37 - 153 U/L   AST 10 10 - 35 U/L   ALT 10 6 - 29 U/L  Hemoglobin A1c  Result Value Ref Range   Hgb A1c MFr Bld 12.8 (H) <5.7 % of total Hgb   Mean Plasma Glucose 321 mg/dL   eAG (mmol/L) 17.8 mmol/L  Hepatitis C antibody  Result Value Ref Range   Hepatitis C Ab NON-REACTIVE NON-REACTIVE  HIV Antibody (routine testing w rflx)  Result Value Ref Range   HIV 1&2 Ab, 4th Generation NON-REACTIVE NON-REACTIVE      Assessment & Plan:   Problem List Items Addressed This Visit   None    Follow up plan: No follow-ups on file.      

## 2023-06-06 ENCOUNTER — Other Ambulatory Visit: Payer: Self-pay

## 2023-06-06 ENCOUNTER — Ambulatory Visit (INDEPENDENT_AMBULATORY_CARE_PROVIDER_SITE_OTHER): Payer: BC Managed Care – PPO | Admitting: Nurse Practitioner

## 2023-06-06 ENCOUNTER — Encounter: Payer: Self-pay | Admitting: Nurse Practitioner

## 2023-06-06 ENCOUNTER — Ambulatory Visit: Payer: BC Managed Care – PPO | Admitting: Podiatry

## 2023-06-06 VITALS — BP 128/72 | HR 98 | Temp 98.1°F | Resp 18 | Ht 65.0 in | Wt 270.2 lb

## 2023-06-06 DIAGNOSIS — E1159 Type 2 diabetes mellitus with other circulatory complications: Secondary | ICD-10-CM

## 2023-06-06 DIAGNOSIS — Z794 Long term (current) use of insulin: Secondary | ICD-10-CM | POA: Diagnosis not present

## 2023-06-06 MED ORDER — TIRZEPATIDE 7.5 MG/0.5ML ~~LOC~~ SOAJ
7.5000 mg | SUBCUTANEOUS | 0 refills | Status: DC
Start: 2023-06-06 — End: 2023-07-10

## 2023-06-06 NOTE — Assessment & Plan Note (Signed)
Continue mounjaro continue to work on diet

## 2023-06-06 NOTE — Assessment & Plan Note (Signed)
Blood sugars improving continue with mounjaro, increased dose sent in 7.5 mg.  Continue toujeo 40 units daily

## 2023-07-07 ENCOUNTER — Other Ambulatory Visit: Payer: Self-pay | Admitting: Nurse Practitioner

## 2023-07-07 DIAGNOSIS — E1159 Type 2 diabetes mellitus with other circulatory complications: Secondary | ICD-10-CM

## 2023-07-07 NOTE — Telephone Encounter (Signed)
Requested medication (s) are due for refill today - no  Requested medication (s) are on the active medication list -yes  Future visit scheduled -yes  Last refill: 06/06/23 6ml  Notes to clinic: off protocol- provider review   Requested Prescriptions  Pending Prescriptions Disp Refills   MOUNJARO 7.5 MG/0.5ML Pen [Pharmacy Med Name: Mounjaro 7.5 MG/0.5ML Subcutaneous Solution Pen-injector] 4 mL 0    Sig: INJECT 7.5MG  SUBCUTANEOUSLY  ONCE A WEEK     Off-Protocol Failed - 07/07/2023  9:08 AM      Failed - Medication not assigned to a protocol, review manually.      Passed - Valid encounter within last 12 months    Recent Outpatient Visits           1 month ago Type 2 diabetes mellitus with other circulatory complication, with long-term current use of insulin [facility])   [place] [facility] Della Goo F, FNP   2 months ago Type 2 diabetes mellitus with other circulatory complication, with long-term current use of insulin [facility])   [place] [facility] Berniece Salines, FNP       Future Appointments             In 3 weeks Zane Herald Rudolpho Sevin, FNP [facility], [facility]               Requested Prescriptions  Pending Prescriptions Disp Refills   MOUNJARO 7.5 MG/0.5ML Pen [Pharmacy Med Name: Mounjaro 7.5 MG/0.5ML Subcutaneous Solution Pen-injector] 4 mL 0    Sig: INJECT 7.5MG  SUBCUTANEOUSLY  ONCE A WEEK     Off-Protocol Failed - 07/07/2023  9:08 AM      Failed - Medication not assigned to a protocol, review manually.      Passed - Valid encounter within last 12 months    Recent Outpatient Visits           1 month ago Type 2 diabetes mellitus with other circulatory complication, with long-term current use of insulin [facility])   [place] [facility] Della Goo F, FNP   2 months ago Type 2 diabetes mellitus with other circulatory complication, with long-term current use of insulin [facility])     [facility] Berniece Salines, FNP       Future Appointments             In 3 weeks Zane Herald, Rudolpho Sevin, FNP [facility], [facility]

## 2023-07-10 ENCOUNTER — Other Ambulatory Visit: Payer: Self-pay | Admitting: Nurse Practitioner

## 2023-07-10 ENCOUNTER — Other Ambulatory Visit: Payer: Self-pay

## 2023-07-10 DIAGNOSIS — Z794 Long term (current) use of insulin: Secondary | ICD-10-CM

## 2023-07-10 MED ORDER — TIRZEPATIDE 10 MG/0.5ML ~~LOC~~ SOAJ
10.0000 mg | SUBCUTANEOUS | 0 refills | Status: DC
Start: 2023-07-10 — End: 2023-08-07

## 2023-07-10 NOTE — Telephone Encounter (Signed)
Medication Refill - Medication: insulin glargine, 2 Unit Dial, (TOUJEO MAX SOLOSTAR) 300 UNIT/ML Solostar Pen   Has the patient contacted their pharmacy?  Preferred Pharmacy (with phone number or street name):  [facility] [address], [place] - [address] Phone: [phone]  Fax: [phone]      Has the patient been seen for an appointment in the last year OR does the patient have an upcoming appointment? Yes.    The patient also wants to make sure her provider is sending the right strength of her Greggory Keen as she said the strengths goes up. P;ease assist patient further

## 2023-07-12 ENCOUNTER — Other Ambulatory Visit: Payer: Self-pay | Admitting: Internal Medicine

## 2023-07-12 ENCOUNTER — Other Ambulatory Visit: Payer: Self-pay | Admitting: Nurse Practitioner

## 2023-07-12 ENCOUNTER — Telehealth: Payer: Self-pay | Admitting: Nurse Practitioner

## 2023-07-12 DIAGNOSIS — E1159 Type 2 diabetes mellitus with other circulatory complications: Secondary | ICD-10-CM

## 2023-07-12 MED ORDER — TOUJEO MAX SOLOSTAR 300 UNIT/ML ~~LOC~~ SOPN: 30.0000 [IU] | PEN_INJECTOR | Freq: Every day | SUBCUTANEOUS | 3 refills | Status: DC

## 2023-07-12 NOTE — Telephone Encounter (Signed)
Already filled, duplicate request  Requested Prescriptions  Pending Prescriptions Disp Refills   insulin glargine, 2 Unit Dial, (TOUJEO MAX SOLOSTAR) 300 UNIT/ML Solostar Pen 3 mL 0    Sig: Inject 30 Units into the skin daily.     Endocrinology:  Diabetes - Insulins Failed - 07/10/2023  5:33 PM      Failed - HBA1C is between 0 and 7.9 and within 180 days    Hgb A1c MFr Bld  Date Value Ref Range Status  05/01/2023 12.8 (H) <5.7 % of total Hgb Final    Comment:    For someone without known diabetes, a hemoglobin A1c value of 6.5% or greater indicates that they may have  diabetes and this should be confirmed with a follow-up  test. . For someone with known diabetes, a value <7% indicates  that their diabetes is well controlled and a value  greater than or equal to 7% indicates suboptimal  control. A1c targets should be individualized based on  duration of diabetes, age, comorbid conditions, and  other considerations. . Currently, no consensus exists regarding use of hemoglobin A1c for diagnosis of diabetes for children. Destiny Luna - Valid encounter within last 6 months    Recent Outpatient Visits           1 month ago Type 2 diabetes mellitus with other circulatory complication, with long-term current use of insulin [facility])   [place] [facility] Della Goo F, FNP   2 months ago Type 2 diabetes mellitus with other circulatory complication, with long-term current use of insulin [facility])   [place] [facility] Berniece Salines, FNP       Future Appointments             In 2 weeks Zane Herald, Rudolpho Sevin, FNP [facility], [facility]

## 2023-07-12 NOTE — Telephone Encounter (Signed)
The patient called in wanting to know If her provider can call in multiple refills instead of just 1 at a time on her insulin glargine, 2 Unit Dial, (TOUJEO MAX SOLOSTAR) 300 UNIT/ML Solostar Pen . Please assist patient further

## 2023-07-12 NOTE — Telephone Encounter (Signed)
Patient would like refills put on medication

## 2023-07-13 NOTE — Telephone Encounter (Signed)
Rx refilled 07/10/23 by provider  Requested Prescriptions  Pending Prescriptions Disp Refills   TOUJEO MAX SOLOSTAR 300 UNIT/ML Solostar Pen [Pharmacy Med Name: Myrlene Broker SoloStar 300 UNIT/ML Subcutaneous Solution Pen-injector] 6 mL 0    Sig: INJECT 30 UNITS SUBCUTANEOUSLY ONCE DAILY     Endocrinology:  Diabetes - Insulins Failed - 07/12/2023 10:12 AM      Failed - HBA1C is between 0 and 7.9 and within 180 days    Hgb A1c MFr Bld  Date Value Ref Range Status  05/01/2023 12.8 (H) <5.7 % of total Hgb Final    Comment:    For someone without known diabetes, a hemoglobin A1c value of 6.5% or greater indicates that they may have  diabetes and this should be confirmed with a follow-up  test. . For someone with known diabetes, a value <7% indicates  that their diabetes is well controlled and a value  greater than or equal to 7% indicates suboptimal  control. A1c targets should be individualized based on  duration of diabetes, age, comorbid conditions, and  other considerations. . Currently, no consensus exists regarding use of hemoglobin A1c for diagnosis of diabetes for children. Verna Czech - Valid encounter within last 6 months    Recent Outpatient Visits           1 month ago Type 2 diabetes mellitus with other circulatory complication, with long-term current use of insulin [facility])   [place] [facility] Della Goo F, FNP   2 months ago Type 2 diabetes mellitus with other circulatory complication, with long-term current use of insulin [facility])   [place] [facility] Berniece Salines, FNP       Future Appointments             In 2 weeks Zane Herald, Rudolpho Sevin, FNP [facility], [facility]

## 2023-07-20 ENCOUNTER — Other Ambulatory Visit: Payer: Self-pay | Admitting: *Deleted

## 2023-07-20 ENCOUNTER — Inpatient Hospital Stay
Admission: RE | Admit: 2023-07-20 | Discharge: 2023-07-20 | Disposition: A | Discharge status: Home / Self Care | Payer: Self-pay | Source: Ambulatory Visit | Attending: Nurse Practitioner | Admitting: Nurse Practitioner

## 2023-07-20 ENCOUNTER — Encounter: Payer: Self-pay | Admitting: Radiology

## 2023-07-20 ENCOUNTER — Ambulatory Visit
Admission: RE | Admit: 2023-07-20 | Discharge: 2023-07-20 | Disposition: A | Discharge status: Home / Self Care | Payer: BC Managed Care – PPO | Source: Ambulatory Visit | Attending: Nurse Practitioner | Admitting: Nurse Practitioner

## 2023-07-20 DIAGNOSIS — Z1231 Encounter for screening mammogram for malignant neoplasm of breast: Secondary | ICD-10-CM | POA: Diagnosis present

## 2023-07-24 ENCOUNTER — Telehealth: Payer: BC Managed Care – PPO | Admitting: Nurse Practitioner

## 2023-07-24 ENCOUNTER — Ambulatory Visit: Payer: Self-pay

## 2023-07-24 DIAGNOSIS — B354 Tinea corporis: Secondary | ICD-10-CM | POA: Diagnosis not present

## 2023-07-24 MED ORDER — TERBINAFINE HCL 1 % EX CREA
1.0000 | TOPICAL_CREAM | Freq: Every day | CUTANEOUS | 0 refills | Status: DC
Start: 2023-07-24 — End: 2024-04-03

## 2023-07-24 MED ORDER — FLUCONAZOLE 150 MG PO TABS
150.0000 mg | ORAL_TABLET | Freq: Once | ORAL | 0 refills | Status: AC
Start: 2023-07-24 — End: 2023-07-24

## 2023-07-24 NOTE — Progress Notes (Signed)
Virtual Visit Consent   [name], you are scheduled for a virtual visit with a [facility] provider today. Just as with appointments in the office, your consent must be obtained to participate. Your consent will be active for this visit and any virtual visit you may have with one of our providers in the next 365 days. If you have a MyChart account, a copy of this consent can be sent to you electronically.  As this is a virtual visit, video technology does not allow for your provider to perform a traditional examination. This may limit your provider's ability to fully assess your condition. If your provider identifies any concerns that need to be evaluated in person or the need to arrange testing (such as labs, EKG, etc.), we will make arrangements to do so. Although advances in technology are sophisticated, we cannot ensure that it will always work on either your end or our end. If the connection with a video visit is poor, the visit may have to be switched to a telephone visit. With either a video or telephone visit, we are not always able to ensure that we have a secure connection.  By engaging in this virtual visit, you consent to the provision of healthcare and authorize for your insurance to be billed (if applicable) for the services provided during this visit. Depending on your insurance coverage, you may receive a charge related to this service.  I need to obtain your verbal consent now. Are you willing to proceed with your visit today? Destiny Luna has provided verbal consent on 07/24/2023 for a virtual visit (video or telephone). [name], FNP  Date: 07/24/2023 12:24 PM  Virtual Visit via Video Note   I, [name], connected with  Destiny Luna  ([id number], Apr 05, 1971) on 07/24/23 at 12:30 PM EDT by a video-enabled telemedicine application and verified that I am speaking with the correct person using two identifiers.  Location: Patient: Virtual Visit Location Patient:  Home Provider: Virtual Visit Location Provider: Home Office   I discussed the limitations of evaluation and management by telemedicine and the availability of in person appointments. The patient expressed understanding and agreed to proceed.    History of Present Illness: Destiny Luna is a 52 y.o. who identifies as a female who was assigned female at birth, and is being seen today with complaints of a rash on the back of her leg.   This rash has occurred in the past   She was visiting her son over the weekend and believes she got the rash while she was there.   The rash is itchy Circular in ringworm fashion   The rash is dry  No drainage   She has been using alcohol for the itching    Problems:  Patient Active Problem List   Diagnosis Date Noted   Morbid obesity (HCC) 05/01/2023   Diabetes mellitus (HCC) 05/01/2023    Allergies: No Known Allergies Medications:  Current Outpatient Medications:    insulin glargine, 2 Unit Dial, (TOUJEO MAX SOLOSTAR) 300 UNIT/ML Solostar Pen, Inject 30 Units into the skin daily., Disp: 3 mL, Rfl: 3   Insulin Pen Needle 32G X 6 MM MISC, 1 each by Does not apply route daily. (Patient not taking: Reported on 06/06/2023), Disp: 50 each, Rfl: 3   losartan (COZAAR) 25 MG tablet, Take 1 tablet (25 mg total) by mouth daily., Disp: 90 tablet, Rfl: 1   tirzepatide (MOUNJARO) 10 MG/0.5ML Pen, Inject 10 mg into the skin once a week.,  Disp: 6 mL, Rfl: 0  Observations/Objective: Patient is well-developed, well-nourished in no acute distress.  Resting comfortably  at home.  Head is normocephalic, atraumatic.  No labored breathing.  Speech is clear and coherent with logical content.  Patient is alert and oriented at baseline.  Right poster calf just distal to knee with oval and round patches/dry   Assessment and Plan:  1. Tinea corporis  - terbinafine (LAMISIL) 1 % cream; Apply 1 Application topically daily.  Dispense: 30 g; Refill: 0 - fluconazole  (DIFLUCAN) 150 MG tablet; Take 1 tablet (150 mg total) by mouth once for 1 dose.  Dispense: 1 tablet; Refill: 0     Follow Up Instructions: I discussed the assessment and treatment plan with the patient. The patient was provided an opportunity to ask questions and all were answered. The patient agreed with the plan and demonstrated an understanding of the instructions.  A copy of instructions were sent to the patient via MyChart unless otherwise noted below.    The patient was advised to call back or seek an in-person evaluation if the symptoms worsen or if the condition fails to improve as anticipated.  Time:  I spent 11 minutes with the patient via telehealth technology discussing the above problems/concerns.    [name], FNP

## 2023-07-24 NOTE — Telephone Encounter (Signed)
Summary: skin irritation / rx req   The patient has experienced skin irritation on the back of their right leg since 07/21/22  The patient shares that the rash may have come from sharing a bed with someone who sweats often  The patient would like to be prescribed abx for their irritation  Please contact further when possible         Chief Complaint: Rash to back of right leg. "Looks like ringworm." Itchy. Symptoms: Above Frequency: Weekend Pertinent Negatives: Patient denies  Disposition: [] ED /[] Urgent Care (no appt availability in office) / [] Appointment(In office/virtual)/ [x]  [facility]/ [] Home Care/ [] Refused Recommended Disposition /[] [place] [facility]/ []  Follow-up with PCP Additional Notes: Agrees with virtual care. No availability in practice today, pt. Going out of town tomorrow. Reason for Disposition  [1] Severe localized itching AND [2] after 2 days of steroid cream  Answer Assessment - Initial Assessment Questions 1. APPEARANCE of RASH: "Describe the rash."      "Looks like ringworm" 2. LOCATION: "Where is the rash located?"      Right leg 3. NUMBER: "How many spots are there?"      5 4. SIZE: "How big are the spots?" (Inches, centimeters or compare to size of a coin)      Small 5. ONSET: "When did the rash start?"      This weekend 6. ITCHING: "Does the rash itch?" If Yes, ask: "How bad is the itch?"  (Scale 0-10; or none, mild, moderate, severe)     Mild 7. PAIN: "Does the rash hurt?" If Yes, ask: "How bad is the pain?"  (Scale 0-10; or none, mild, moderate, severe)    - NONE (0): no pain    - MILD (1-3): doesn't interfere with normal activities     - MODERATE (4-7): interferes with normal activities or awakens from sleep     - SEVERE (8-10): excruciating pain, unable to do any normal activities     None 8. OTHER SYMPTOMS: "Do you have any other symptoms?" (e.g., fever)     No 9. PREGNANCY: "Is there any chance you are pregnant?" "When was  your last menstrual period?"     No  Protocols used: Rash or Redness - Localized-A-AH

## 2023-08-01 ENCOUNTER — Ambulatory Visit: Payer: BC Managed Care – PPO | Admitting: Nurse Practitioner

## 2023-08-01 NOTE — Progress Notes (Deleted)
There were no vitals taken for this visit.   Subjective:    Patient ID: Destiny Luna, female    DOB: [birth year], 52 y.o.   MRN: [id number]  HPI: Destiny Luna is a 52 y.o. female  No chief complaint on file.  Diabetes, Type 2:  -Last A1c 12.8 -Medications: mounjaro *** mg weekly, toujeo 40 units daily -Patient is compliant with the above medications and reports no side effects. *** -Checking BG at home: yes -Fasting home BG: *** -Eye exam: due -Foot exam: utd -Microalbumin: utd -Statin: no -Denies symptoms of hypoglycemia, polyuria, polydipsia, numbness extremities, currently being treated foot ulcers by podiatry   Obesity:  Current weight : *** BMI: *** Previous weight:270 lbs Treatment Tried: currently on mounjaro Comorbidities: DM  Diet: she  has been working on lifestyle modification including cutting back on sweets.    Relevant past medical, surgical, family and social history reviewed and updated as indicated. Interim medical history since our last visit reviewed. Allergies and medications reviewed and updated.  Review of Systems  Constitutional: Negative for fever or weight change.  Respiratory: Negative for cough and shortness of breath.   Cardiovascular: Negative for chest pain or palpitations.  Gastrointestinal: Negative for abdominal pain, no bowel changes.  Musculoskeletal: Negative for gait problem or joint swelling.  Skin: Negative for rash.  Neurological: Negative for dizziness or headache.  No other specific complaints in a complete review of systems (except as listed in HPI above).      Objective:    There were no vitals taken for this visit.  Wt Readings from Last 3 Encounters:  06/06/23 270 lb 3.2 oz (122.6 kg)  05/01/23 273 lb 1.6 oz (123.9 kg)  05/13/22 250 lb (113.4 kg)    Physical Exam  Constitutional: Patient appears well-developed and well-nourished. Obese  No distress.  HEENT: head atraumatic, normocephalic, pupils equal and  reactive to light, neck supple, throat within normal limits Cardiovascular: Normal rate, regular rhythm and normal heart sounds.  No murmur heard. No BLE edema. Pulmonary/Chest: Effort normal and breath sounds normal. No respiratory distress. Abdominal: Soft.  There is no tenderness. Psychiatric: Patient has a normal mood and affect. behavior is normal. Judgment and thought content normal.   Results for orders placed or performed in visit on 05/01/23  Lipid panel  Result Value Ref Range   Cholesterol 197 <200 mg/dL   HDL 46 (L) > OR = 50 mg/dL   Triglycerides 956 (H) <150 mg/dL   LDL Cholesterol (Calc) 112 (H) mg/dL (calc)   Total CHOL/HDL Ratio 4.3 <5.0 (calc)   Non-HDL Cholesterol (Calc) 151 (H) <130 mg/dL (calc)  Microalbumin / creatinine urine ratio  Result Value Ref Range   Creatinine, Urine 96 20 - 275 mg/dL   Microalb, Ur 213.0 mg/dL   Microalb Creat Ratio 1,418 (H) <30 mg/g creat  CBC with Differential/Platelet  Result Value Ref Range   WBC 6.3 3.8 - 10.8 Thousand/uL   RBC 4.97 3.80 - 5.10 Million/uL   Hemoglobin 12.8 11.7 - 15.5 g/dL   HCT 86.5 78.4 - 69.6 %   MCV 81.5 80.0 - 100.0 fL   MCH 25.8 (L) 27.0 - 33.0 pg   MCHC 31.6 (L) 32.0 - 36.0 g/dL   RDW 29.5 28.4 - 13.2 %   Platelets 311 140 - 400 Thousand/uL   MPV 10.6 7.5 - 12.5 fL   Neutro Abs 2,375 1,500 - 7,800 cells/uL   Lymphs Abs 3,238 850 - 3,900 cells/uL   Absolute Monocytes  504 200 - 950 cells/uL   Eosinophils Absolute 120 15 - 500 cells/uL   Basophils Absolute 63 0 - 200 cells/uL   Neutrophils Relative % 37.7 %   Total Lymphocyte 51.4 %   Monocytes Relative 8.0 %   Eosinophils Relative 1.9 %   Basophils Relative 1.0 %  COMPLETE METABOLIC PANEL WITH GFR  Result Value Ref Range   Glucose, Bld 260 (H) 65 - 99 mg/dL   BUN 19 7 - 25 mg/dL   Creat 5.62 1.30 - 8.65 mg/dL   eGFR 76 > OR = 60 HQ/ION/6.29B2   BUN/Creatinine Ratio SEE NOTE: 6 - 22 (calc)   Sodium 142 135 - 146 mmol/L   Potassium 4.5 3.5 -  5.3 mmol/L   Chloride 104 98 - 110 mmol/L   CO2 28 20 - 32 mmol/L   Calcium 9.1 8.6 - 10.4 mg/dL   Total Protein 6.9 6.1 - 8.1 g/dL   Albumin 3.8 3.6 - 5.1 g/dL   Globulin 3.1 1.9 - 3.7 g/dL (calc)   AG Ratio 1.2 1.0 - 2.5 (calc)   Total Bilirubin 0.3 0.2 - 1.2 mg/dL   Alkaline phosphatase (APISO) 109 37 - 153 U/L   AST 10 10 - 35 U/L   ALT 10 6 - 29 U/L  Hemoglobin A1c  Result Value Ref Range   Hgb A1c MFr Bld 12.8 (H) <5.7 % of total Hgb   Mean Plasma Glucose 321 mg/dL   eAG (mmol/L) 84.1 mmol/L  Hepatitis C antibody  Result Value Ref Range   Hepatitis C Ab NON-REACTIVE NON-REACTIVE  HIV Antibody (routine testing w rflx)  Result Value Ref Range   HIV 1&2 Ab, 4th Generation NON-REACTIVE NON-REACTIVE      Assessment & Plan:   Problem List Items Addressed This Visit   None     Follow up plan: No follow-ups on file.

## 2023-08-07 ENCOUNTER — Encounter: Payer: Self-pay | Admitting: Nurse Practitioner

## 2023-08-07 ENCOUNTER — Other Ambulatory Visit: Payer: Self-pay

## 2023-08-07 ENCOUNTER — Ambulatory Visit (INDEPENDENT_AMBULATORY_CARE_PROVIDER_SITE_OTHER): Payer: BC Managed Care – PPO | Admitting: Nurse Practitioner

## 2023-08-07 VITALS — BP 126/72 | HR 98 | Temp 98.5°F | Resp 16 | Ht 65.0 in | Wt 269.8 lb

## 2023-08-07 DIAGNOSIS — Z794 Long term (current) use of insulin: Secondary | ICD-10-CM

## 2023-08-07 DIAGNOSIS — E1159 Type 2 diabetes mellitus with other circulatory complications: Secondary | ICD-10-CM | POA: Diagnosis not present

## 2023-08-07 LAB — POCT GLYCOSYLATED HEMOGLOBIN (HGB A1C): Hemoglobin A1C: 8.1 % — AB (ref 4.0–5.6)

## 2023-08-07 MED ORDER — TOUJEO MAX SOLOSTAR 300 UNIT/ML ~~LOC~~ SOPN
42.0000 [IU] | PEN_INJECTOR | Freq: Every day | SUBCUTANEOUS | 10 refills | Status: DC
Start: 2023-08-07 — End: 2023-10-06

## 2023-08-07 NOTE — Progress Notes (Signed)
BP 126/72   Pulse 98   Temp 98.5 F (36.9 C) (Oral)   Resp 16   Ht 5\' 5"  (1.651 m)   Wt 269 lb 12.8 oz (122.4 kg)   SpO2 96%   BMI 44.90 kg/m    Subjective:    Patient ID: Destiny Luna, female    DOB: [birth year], 52 y.o.   MRN: [id number]  HPI: Destiny Luna is a 52 y.o. female  Chief Complaint  Patient presents with   Diabetes   Obesity   Diabetes, Type 2:  -Last A1c 12.8, today 8.1 -Medications: mounjaro 10 mg weekly, toujeo 42 units daily -Patient is compliant with the above medications and reports she has been having diarrhea with the mounjaro.  Will discontinue. She would like to consider ozempic.  -Checking BG at home: yes -Fasting home BG: 130-140 -Eye exam: due -Foot exam: utd -Microalbumin: utd -Statin: no -Denies symptoms of hypoglycemia, polyuria, polydipsia, numbness extremities, currently being treated foot ulcers by podiatry   Obesity:  Current weight : 269 lbs BMI: 44.90 Previous weight:270 lbs Treatment Tried: currently on mounjaro, does not tolerate it.  Would like to consider ozempic Comorbidities: DM  Diet: she  has been working on lifestyle modification including cutting back on sweets.    Relevant past medical, surgical, family and social history reviewed and updated as indicated. Interim medical history since our last visit reviewed. Allergies and medications reviewed and updated.  Review of Systems  Constitutional: Negative for fever or weight change.  Respiratory: Negative for cough and shortness of breath.   Cardiovascular: Negative for chest pain or palpitations.  Gastrointestinal: Negative for abdominal pain, no bowel changes.  Musculoskeletal: Negative for gait problem or joint swelling.  Skin: Negative for rash.  Neurological: Negative for dizziness or headache.  No other specific complaints in a complete review of systems (except as listed in HPI above).      Objective:    BP 126/72   Pulse 98   Temp 98.5 F (36.9  C) (Oral)   Resp 16   Ht 5\' 5"  (1.651 m)   Wt 269 lb 12.8 oz (122.4 kg)   SpO2 96%   BMI 44.90 kg/m   Wt Readings from Last 3 Encounters:  08/07/23 269 lb 12.8 oz (122.4 kg)  06/06/23 270 lb 3.2 oz (122.6 kg)  05/01/23 273 lb 1.6 oz (123.9 kg)    Physical Exam  Constitutional: Patient appears well-developed and well-nourished. Obese  No distress.  HEENT: head atraumatic, normocephalic, pupils equal and reactive to light, neck supple, throat within normal limits Cardiovascular: Normal rate, regular rhythm and normal heart sounds.  No murmur heard. No BLE edema. Pulmonary/Chest: Effort normal and breath sounds normal. No respiratory distress. Abdominal: Soft.  There is no tenderness. Psychiatric: Patient has a normal mood and affect. behavior is normal. Judgment and thought content normal.   Results for orders placed or performed in visit on 08/07/23  POCT HgB A1C  Result Value Ref Range   Hemoglobin A1C 8.1 (A) 4.0 - 5.6 %   HbA1c POC (<> result, manual entry)     HbA1c, POC (prediabetic range)     HbA1c, POC (controlled diabetic range)        Assessment & Plan:   Problem List Items Addressed This Visit       Endocrine   Diabetes mellitus (HCC) - Primary    Continue toujeo 42 units daily, discontinue mounjaro due to side effects.       Relevant Medications  insulin glargine, 2 Unit Dial, (TOUJEO MAX SOLOSTAR) 300 UNIT/ML Solostar Pen   Other Relevant Orders   POCT HgB A1C (Completed)     Other   Morbid obesity (HCC)    Discontinue mounjaro due to side effects. Continue to work on lifestyle modification      Relevant Medications   insulin glargine, 2 Unit Dial, (TOUJEO MAX SOLOSTAR) 300 UNIT/ML Solostar Pen      Follow up plan: Return in about 4 months (around 12/07/2023) for follow up.

## 2023-08-07 NOTE — Assessment & Plan Note (Signed)
Discontinue mounjaro due to side effects. Continue to work on lifestyle modification

## 2023-08-07 NOTE — Assessment & Plan Note (Signed)
Continue toujeo 42 units daily, discontinue mounjaro due to side effects.

## 2023-08-14 ENCOUNTER — Ambulatory Visit: Payer: Self-pay

## 2023-08-14 ENCOUNTER — Encounter: Payer: Self-pay | Admitting: Nurse Practitioner

## 2023-08-14 NOTE — Telephone Encounter (Signed)
She already been doing that  for 3 days when she noticed spike. She actually went up by 4 units. Would like a call from you or she can will come in again for appointment

## 2023-08-14 NOTE — Telephone Encounter (Signed)
Spoke to patient BS spiking since stopping Mounjaro. This morning is was 148. Normally it runs from 100-120. Is there anything else she can try to benefit her. Mounjaro gave her diarrhea

## 2023-08-14 NOTE — Telephone Encounter (Signed)
If you have a minute free she said will you give her a call

## 2023-08-14 NOTE — Telephone Encounter (Signed)
Summary: BS going up since she stopped Monjaro.   Pt  states her BS have spiked since the dr took her off [place].  The have gone from 113 to 157. (This am) Yesterday 144.  She states those are high for her.  She was always around 100 before.  Wants dr to call her.        114     Chief Complaint: Pt. Has noticed blood glucose trending up since stopping Mounjaro . Usually 114 or lower and now up tp 157 fasting. Asking to try another medication. States she does not tolerate Metformin either. Symptoms: None Frequency: 2 weeks Pertinent Negatives: Patient denies  Disposition: [] ED /[] Urgent Care (no appt availability in office) / [] Appointment(In office/virtual)/ []  [facility]/ [] Home Care/ [] Refused Recommended Disposition /[] [facility]/ [x]  Follow-up with PCP Additional Notes: Please advise pt.  Reason for Disposition  [1] Caller has NON-URGENT medication or insulin pump question AND [2] triager unable to answer question  Answer Assessment - Initial Assessment Questions 1. BLOOD GLUCOSE: "What is your blood glucose level?"      144 2. ONSET: "When did you check the blood glucose?"     Yesterday 3. USUAL RANGE: "What is your glucose level usually?" (e.g., usual fasting morning value, usual evening value)     114 4. KETONES: "Do you check for ketones (urine or blood test strips)?" If Yes, ask: "What does the test show now?"      No 5. TYPE 1 or 2:  "Do you know what type of diabetes you have?"  (e.g., Type 1, Type 2, Gestational; doesn't know)      Type 2 6. INSULIN: "Do you take insulin?" "What type of insulin(s) do you use? What is the mode of delivery? (syringe, pen; injection or pump)?"      No 7. DIABETES PILLS: "Do you take any pills for your diabetes?" If Yes, ask: "Have you missed taking any pills recently?"     No 8. OTHER SYMPTOMS: "Do you have any symptoms?" (e.g., fever, frequent urination, difficulty breathing, dizziness, weakness, vomiting)      No 9. PREGNANCY: "Is there any chance you are pregnant?" "When was your last menstrual period?"     No  Protocols used: Diabetes - High Blood Sugar-A-AH

## 2023-08-15 ENCOUNTER — Other Ambulatory Visit: Payer: Self-pay | Admitting: Nurse Practitioner

## 2023-08-15 DIAGNOSIS — Z794 Long term (current) use of insulin: Secondary | ICD-10-CM

## 2023-08-15 MED ORDER — GLIMEPIRIDE 2 MG PO TABS: 2.0000 mg | ORAL_TABLET | Freq: Every day | ORAL | 1 refills | Status: DC

## 2023-08-16 ENCOUNTER — Other Ambulatory Visit: Payer: Self-pay | Admitting: Nurse Practitioner

## 2023-08-16 DIAGNOSIS — E1159 Type 2 diabetes mellitus with other circulatory complications: Secondary | ICD-10-CM

## 2023-08-16 MED ORDER — MOUNJARO 7.5 MG/0.5ML ~~LOC~~ SOAJ
7.5000 mg | SUBCUTANEOUS | 1 refills | Status: DC
Start: 2023-08-16 — End: 2024-04-03

## 2023-08-25 NOTE — Telephone Encounter (Signed)
Spoke to patient and she stated she do not want her medication to go to  [facility] order [facility]. She stated she is trying to get coupon cards for [facility] to see if she can get medication at the price she had before. Will call office back when she find out something

## 2023-09-02 ENCOUNTER — Encounter: Payer: Self-pay | Admitting: Podiatry

## 2023-09-02 MED ORDER — DOXYCYCLINE HYCLATE 100 MG PO TABS: 100.0000 mg | ORAL_TABLET | Freq: Two times a day (BID) | ORAL | 0 refills | Status: DC

## 2023-09-04 ENCOUNTER — Ambulatory Visit (INDEPENDENT_AMBULATORY_CARE_PROVIDER_SITE_OTHER): Payer: BC Managed Care – PPO

## 2023-09-04 ENCOUNTER — Ambulatory Visit (INDEPENDENT_AMBULATORY_CARE_PROVIDER_SITE_OTHER): Payer: BC Managed Care – PPO | Admitting: Podiatry

## 2023-09-04 DIAGNOSIS — E0843 Diabetes mellitus due to underlying condition with diabetic autonomic (poly)neuropathy: Secondary | ICD-10-CM

## 2023-09-04 DIAGNOSIS — L97522 Non-pressure chronic ulcer of other part of left foot with fat layer exposed: Secondary | ICD-10-CM

## 2023-09-04 MED ORDER — GENTAMICIN SULFATE 0.1 % EX CREA: 1.0000 | TOPICAL_CREAM | Freq: Two times a day (BID) | CUTANEOUS | 1 refills | Status: DC

## 2023-09-04 NOTE — Progress Notes (Signed)
   Chief Complaint  Patient presents with   Toe Pain    LEFT 5TH HAD PUSS AND BLOOD COME OUT OF IT WHEN SHE OPENED UP THE LESION, BEEN THERE FOR 4-5 DAYS, DIABETIC, DENIES N/V/F/C/SOB    Subjective:  52 y.o. female with PMHx of diabetes mellitus presenting today for new onset of an ulcer that developed to the left fifth toe.  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  On 08/31/2023 she began to notice some bruising with drainage coming from her left fifth toe.  She actually called over the weekend and doxycycline 100 mg 2 times daily #20 was sent to the pharmacy.  She began taking it yesterday.  presenting for further treatment evaluation   Past Medical History:  Diagnosis Date   Diabetes mellitus without complication (HCC)     Past Surgical History:  Procedure Laterality Date   CESAREAN SECTION     HERNIA REPAIR      No Known Allergies    LT fifth toe 09/04/2023  Objective/Physical Exam General: The patient is alert and oriented x3 in no acute distress.  Dermatology:  Wound #1 noted to the left fifth toe measuring approximately 1.5 to 1.5 x 0.1 cm (LxWxD).   To the noted ulceration(s), there is no eschar. There is a moderate amount of slough, fibrin, and necrotic tissue noted. Granulation tissue and wound base is red. There is a minimal amount of serosanguineous drainage noted. There is no exposed bone muscle-tendon ligament or joint. There is no malodor. Periwound integrity is intact. Skin is warm, dry and supple bilateral lower extremities.  Vascular: Palpable pedal pulses bilaterally. No edema or erythema noted. Capillary refill within normal limits.  Neurological: Epicritic and protective threshold diminished bilaterally.   Musculoskeletal Exam: Range of motion within normal limits to all pedal and ankle joints bilateral. Muscle strength 5/5 in all groups bilateral.   Radiographic exam LT foot 09/04/2023:  There is some osseous irregularity noted to the phalanges of the left fifth toe.  No acute fracture identified.  The irregularities do not seem to correlate with the wound.  There is also some very subtle radiolucency that is very superficial to the distal tip of the toe  Assessment: 1.  Ulcer left fifth toe secondary to diabetes mellitus 2. diabetes mellitus w/ peripheral neuropathy   Plan of Care:  -Patient was evaluated. -Medically necessary excisional debridement including subcutaneous tissue was performed using a tissue nipper and a chisel blade. Excisional debridement of all the necrotic nonviable tissue down to healthy bleeding viable tissue was performed with post-debridement measurements same as pre-. -Continue doxycycline 100 mg twice daily x 10 days -Prescription for gentamicin cream apply daily with a Band-Aid -Patient is going to use her FMLA to take some time off from work to ensure that the toe heals. 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 Felecia Shelling, DPM    [address] [place], [place] [address]                Office [phone]  Fax [phone]

## 2023-09-18 ENCOUNTER — Ambulatory Visit (INDEPENDENT_AMBULATORY_CARE_PROVIDER_SITE_OTHER): Payer: BC Managed Care – PPO | Admitting: Podiatry

## 2023-09-18 ENCOUNTER — Encounter: Payer: Self-pay | Admitting: Podiatry

## 2023-09-18 DIAGNOSIS — L97522 Non-pressure chronic ulcer of other part of left foot with fat layer exposed: Secondary | ICD-10-CM

## 2023-09-18 NOTE — Progress Notes (Signed)
   Chief Complaint  Patient presents with   Routine Post Op    PATIENT STATES THAT HER 5TH TOE IS DOING REALLY WELL , PATIENT WANTS TO KNOW IF YOU CAN DO HER FMLA.    Subjective:  52 y.o. female with PMHx of diabetes mellitus presenting today for follow-up evaluation of an ulcer to the left fifth toe.  Patient states that she is doing well.  She completed the oral antibiotics and the wound has improved significantly.  Overall she is very satisfied and she is requesting new FMLA paperwork today   Past Medical History:  Diagnosis Date   Diabetes mellitus without complication (HCC)     Past Surgical History:  Procedure Laterality Date   CESAREAN SECTION     HERNIA REPAIR      No Known Allergies    LT fifth toe 09/04/2023  Objective/Physical Exam General: The patient is alert and oriented x3 in no acute distress.  Dermatology:  The ulcer to the left fifth toe is completely resolved and healed.  No open wound noted.  No edema or concern clinically for infection.  Vascular: Palpable pedal pulses bilaterally. No edema or erythema noted. Capillary refill within normal limits.  Neurological: Light touch and protective threshold diminished bilaterally.   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  No prior amputations.  Patient ambulatory  Radiographic exam LT foot 09/04/2023: There is some osseous irregularity noted to the phalanges of the left fifth toe.  No acute fracture identified.  The irregularities do not seem to correlate with the wound.  There is also some very subtle radiolucency that is very superficial to the distal tip of the toe  Assessment: 1.  Ulcer left fifth toe secondary to diabetes mellitus 2. diabetes mellitus w/ peripheral neuropathy   Plan of Care:  -Patient was evaluated. - The wound has now healed and resolved to the left fifth toe. 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 Felecia Shelling, DPM    [address] [place], [place] [address]                Office [phone]  Fax [phone]

## 2023-09-25 ENCOUNTER — Encounter: Payer: Self-pay | Admitting: Nurse Practitioner

## 2023-10-06 ENCOUNTER — Telehealth: Payer: Self-pay | Admitting: Nurse Practitioner

## 2023-10-06 ENCOUNTER — Other Ambulatory Visit: Payer: Self-pay | Admitting: Nurse Practitioner

## 2023-10-06 DIAGNOSIS — Z794 Long term (current) use of insulin: Secondary | ICD-10-CM

## 2023-10-06 MED ORDER — TOUJEO MAX SOLOSTAR 300 UNIT/ML ~~LOC~~ SOPN: 60.0000 [IU] | PEN_INJECTOR | Freq: Every day | SUBCUTANEOUS | 10 refills | Status: DC

## 2023-10-06 NOTE — Telephone Encounter (Signed)
FYI

## 2023-10-06 NOTE — Telephone Encounter (Signed)
Destiny Luna calling from [place]  is calling to request if script can be resent for insulin glargine, 2 Unit Dial, (TOUJEO MAX SOLOSTAR) 300 UNIT/ML Solostar Pen [[id number]]  with a max of 60 units because needing copay to be $35.  [facility] [address], [place] - [address] Phone: [phone]  Fax: [phone]

## 2023-10-10 NOTE — Telephone Encounter (Signed)
Spoke to patient. She is currently taking Toujeo and insurance will no longer cover. Patient was informed to call her insurance company to see what they would pay for in that class and call office back. Office will then check on a PA

## 2023-10-13 ENCOUNTER — Ambulatory Visit (INDEPENDENT_AMBULATORY_CARE_PROVIDER_SITE_OTHER): Payer: BC Managed Care – PPO | Admitting: Podiatry

## 2023-10-13 ENCOUNTER — Telehealth: Payer: Self-pay | Admitting: Nurse Practitioner

## 2023-10-13 ENCOUNTER — Other Ambulatory Visit: Payer: Self-pay | Admitting: Nurse Practitioner

## 2023-10-13 ENCOUNTER — Encounter: Payer: Self-pay | Admitting: Podiatry

## 2023-10-13 DIAGNOSIS — E1159 Type 2 diabetes mellitus with other circulatory complications: Secondary | ICD-10-CM

## 2023-10-13 DIAGNOSIS — L97522 Non-pressure chronic ulcer of other part of left foot with fat layer exposed: Secondary | ICD-10-CM | POA: Diagnosis not present

## 2023-10-13 MED ORDER — TRESIBA FLEXTOUCH 100 UNIT/ML ~~LOC~~ SOPN: 60.0000 [IU] | PEN_INJECTOR | Freq: Every day | SUBCUTANEOUS | 5 refills | Status: DC

## 2023-10-13 NOTE — Telephone Encounter (Signed)
Please send in new script. List of what insurance will cover provided

## 2023-10-13 NOTE — Telephone Encounter (Signed)
Call from pt. She is very worried that she will not get her insulin prescribed today. She is going out of town on Sunday and needs this rx today.  Her insurance will no longer pay for Toujeo and medication is $1000 without insurance.  Pt has spoken to her insurance and that have made recommendation of what they will pay for.  Please see note below from pt:   Gordy Councilman  to [facility] (supporting Berniece Salines, FNP)    10/13/23 12:50 PM Hiii!   I've been calling trying to get in contact with the Nurse who called me Tuesday. I called my insurance for a list of Insulins they would pay for. Here are the ones they recommended that was equivalent to Toujeo perhaps:  Basaglar - $176.23  Semglee- 2 fill $79.10 for 30 days or 90 days for [place]- 2 fills $119.41 for 30 days    I prefer to have the very best I can get and will work on getting a discount card.   Please advise as I have about 2 days of insulin left.

## 2023-10-13 NOTE — Telephone Encounter (Signed)
Pt is calling in because she needs a prescription for a new insulin and is requesting to speak with Julie's nurse regarding this matter. Pt says she has called and left messages but hasn't heard back. Please follow up with pt. Pt says she needs this today.

## 2023-10-13 NOTE — Progress Notes (Signed)
   Chief Complaint  Patient presents with   Diabetes    "I got a cut on my foot." N - cut on my foot L - 5th met left D - Monday O - suddenly C - crack A - none T - none    Subjective:  52 y.o. female with PMHx of diabetes mellitus presenting today for follow-up evaluation of an ulcer to the left fifth toe.  Patient states that she is doing well.  She completed the oral antibiotics and the wound has improved significantly.  Overall she is very satisfied and she is requesting new FMLA paperwork today   Past Medical History:  Diagnosis Date   Diabetes mellitus without complication (HCC)     Past Surgical History:  Procedure Laterality Date   CESAREAN SECTION     HERNIA REPAIR      No Known Allergies   Objective/Physical Exam General: The patient is alert and oriented x3 in no acute distress.  Dermatology:  The ulcer to the left fifth toe is completely resolved and healed.  No open wound noted.  No edema or concern clinically for infection There is a small partial-thickness superficial fissure noted along the plantar aspect of the left foot limited to breakdown of skin.  The fissure does not extend into the subcutaneous tissue  Vascular: Palpable pedal pulses bilaterally. No edema or erythema noted. Capillary refill within normal limits.  Neurological: Light touch and protective threshold diminished bilaterally.   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  No prior amputations.  Patient ambulatory  Radiographic exam LT foot 09/04/2023: There is some osseous irregularity noted to the phalanges of the left fifth toe.  No acute fracture identified.  The irregularities do not seem to correlate with the wound.  There is also some very subtle radiolucency that is very superficial to the distal tip of the toe  Assessment: 1.  Ulcer left fifth toe; resolved 2. diabetes mellitus w/ peripheral neuropathy 3.  Partial-thickness skin  fissure plantar aspect of the left forefoot  Plan of Care:  -Patient was evaluated. - The wound has now healed and resolved to the left fifth toe. -Continue wearing good supportive shoes and sneakers -Message sent to our [facility] specialist here in the office to update the patient's FMLA paperwork -Recommend triple antibiotic to the superficial fissure.  Advised against going barefoot.  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 Felecia Shelling, DPM    [address] [place], [place] [address]                Office [phone]  Fax [phone]

## 2023-10-22 ENCOUNTER — Encounter: Payer: Self-pay | Admitting: Podiatry

## 2023-10-22 MED ORDER — DOXYCYCLINE HYCLATE 100 MG PO TABS: 100.0000 mg | ORAL_TABLET | Freq: Two times a day (BID) | ORAL | 0 refills | Status: DC

## 2023-10-23 ENCOUNTER — Telehealth: Payer: Self-pay | Admitting: Podiatry

## 2023-10-23 NOTE — Telephone Encounter (Signed)
Left message for pt to call to get scheduled to see one of the providers, Possibly Dr Logan Bores today(11/25) at 345pm.

## 2023-10-24 NOTE — Telephone Encounter (Signed)
Pt called back and feels like the antibiotic that was sent in is helping with the swelling and pain. I did schedule her to follow up on 12/13 in [place] office.

## 2023-11-10 ENCOUNTER — Ambulatory Visit: Payer: BC Managed Care – PPO | Admitting: Podiatry

## 2023-12-07 ENCOUNTER — Ambulatory Visit: Payer: BC Managed Care – PPO | Admitting: Nurse Practitioner

## 2023-12-07 DIAGNOSIS — E782 Mixed hyperlipidemia: Secondary | ICD-10-CM | POA: Insufficient documentation

## 2023-12-07 NOTE — Progress Notes (Deleted)
 There were no vitals taken for this visit.   Subjective:    Patient ID: Destiny Luna, female    DOB: Oct 03, 1971, 53 y.o.   MRN: [id number]  HPI: Destiny Luna is a 53 y.o. female  No chief complaint on file.   Discussed the use of AI scribe software for clinical note transcription with the patient, who gave verbal consent to proceed.  History of Present Illness           08/07/2023   12:59 PM 06/06/2023    9:50 AM 05/01/2023    2:49 PM  Depression screen PHQ 2/9  Decreased Interest 0 0 0  Down, Depressed, Hopeless 0 1 0  PHQ - 2 Score 0 1 0  Altered sleeping   0  Tired, decreased energy   3  Change in appetite   3  Feeling bad or failure about yourself    1  Trouble concentrating   0  Moving slowly or fidgety/restless   0  Suicidal thoughts   0  PHQ-9 Score   7  Difficult doing work/chores   Not difficult at all    Relevant past medical, surgical, family and social history reviewed and updated as indicated. Interim medical history since our last visit reviewed. Allergies and medications reviewed and updated.  Review of Systems  Constitutional: Negative for fever or weight change.  Respiratory: Negative for cough and shortness of breath.   Cardiovascular: Negative for chest pain or palpitations.  Gastrointestinal: Negative for abdominal pain, no bowel changes.  Musculoskeletal: Negative for gait problem or joint swelling.  Skin: Negative for rash.  Neurological: Negative for dizziness or headache.  No other specific complaints in a complete review of systems (except as listed in HPI above).      Objective:    There were no vitals taken for this visit.  BP Readings from Last 3 Encounters:  08/07/23 126/72  06/06/23 128/72  05/01/23 124/78     Wt Readings from Last 3 Encounters:  08/07/23 269 lb 12.8 oz (122.4 kg)  06/06/23 270 lb 3.2 oz (122.6 kg)  05/01/23 273 lb 1.6 oz (123.9 kg)    Physical Exam  Constitutional: Patient appears well-developed and  well-nourished. Obese *** No distress.  HEENT: head atraumatic, normocephalic, pupils equal and reactive to light, ears ***, neck supple, throat within normal limits Cardiovascular: Normal rate, regular rhythm and normal heart sounds.  No murmur heard. No BLE edema. Pulmonary/Chest: Effort normal and breath sounds normal. No respiratory distress. Abdominal: Soft.  There is no tenderness. Psychiatric: Patient has a normal mood and affect. behavior is normal. Judgment and thought content normal.  Results for orders placed or performed in visit on 08/07/23  POCT HgB A1C   Collection Time: 08/07/23  1:27 PM  Result Value Ref Range   Hemoglobin A1C 8.1 (A) 4.0 - 5.6 %   HbA1c POC (<> result, manual entry)     HbA1c, POC (prediabetic range)     HbA1c, POC (controlled diabetic range)     Last metabolic panel Lab Results  Component Value Date   GLUCOSE 260 (H) 05/01/2023   NA 142 05/01/2023   K 4.5 05/01/2023   CL 104 05/01/2023   CO2 28 05/01/2023   BUN 19 05/01/2023   CREATININE 0.91 05/01/2023   EGFR 76 05/01/2023   CALCIUM 9.1 05/01/2023   PROT 6.9 05/01/2023   ALBUMIN 3.2 (L) 04/28/2020   BILITOT 0.3 05/01/2023   ALKPHOS 79 04/28/2020   AST 10 05/01/2023  ALT 10 05/01/2023   ANIONGAP 11 04/28/2020   Last lipids Lab Results  Component Value Date   CHOL 197 05/01/2023   HDL 46 (L) 05/01/2023   LDLCALC 112 (H) 05/01/2023   TRIG 271 (H) 05/01/2023   CHOLHDL 4.3 05/01/2023   Last hemoglobin A1c Lab Results  Component Value Date   HGBA1C 8.1 (A) 08/07/2023        Assessment & Plan:   Problem List Items Addressed This Visit   None    Assessment and Plan             Follow up plan: No follow-ups on file.

## 2023-12-19 ENCOUNTER — Encounter: Payer: Self-pay | Admitting: Podiatry

## 2023-12-21 ENCOUNTER — Encounter: Payer: Self-pay | Admitting: Podiatry

## 2023-12-21 ENCOUNTER — Telehealth: Payer: Self-pay | Admitting: Podiatry

## 2023-12-21 NOTE — Telephone Encounter (Signed)
Notified pt letter was written, she would like to pick up today in [place] office. I have asked [place] office to print it and stamp it for pt to pick up today.

## 2023-12-21 NOTE — Telephone Encounter (Signed)
Pt called today to make sure Dr Logan Bores received her note thru my chart as she was not sure it went thru. I did see where it came thru and did explain today is providers surgery day so he may not respond today.

## 2024-01-26 ENCOUNTER — Other Ambulatory Visit: Payer: Self-pay | Admitting: Nurse Practitioner

## 2024-01-26 ENCOUNTER — Telehealth: Payer: Self-pay

## 2024-01-26 DIAGNOSIS — Z794 Long term (current) use of insulin: Secondary | ICD-10-CM

## 2024-01-26 MED ORDER — TRESIBA FLEXTOUCH 100 UNIT/ML ~~LOC~~ SOPN
60.0000 [IU] | PEN_INJECTOR | Freq: Every day | SUBCUTANEOUS | 5 refills | Status: DC
Start: 2024-01-26 — End: 2024-06-01

## 2024-01-26 MED ORDER — INSULIN PEN NEEDLE 32G X 6 MM MISC: 1.0000 | Freq: Every day | 3 refills | Status: AC

## 2024-01-26 MED ORDER — TRESIBA FLEXTOUCH 100 UNIT/ML ~~LOC~~ SOPN: 60.0000 [IU] | PEN_INJECTOR | Freq: Every day | SUBCUTANEOUS | 5 refills | Status: DC

## 2024-01-26 MED ORDER — TRESIBA FLEXTOUCH 100 UNIT/ML ~~LOC~~ SOPN
60.0000 [IU] | PEN_INJECTOR | Freq: Every day | SUBCUTANEOUS | 5 refills | Status: DC
Start: 2024-01-26 — End: 2024-01-26

## 2024-01-26 NOTE — Telephone Encounter (Signed)
 Copied from CRM [id number]. 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 Please follow-up with the patient once its been signed off to [place].

## 2024-01-26 NOTE — Telephone Encounter (Signed)
 Copied from CRM [id number]. Topic: Clinical - Medical Advice >> Jan 26, 2024 12:07 PM Higinio Roger wrote: Patient is wondering if she could get an emergency refill. 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 However, she is traveling and she accidentally left her insulin at home. (TRESIBA FLEXTOUCH) 100 UNIT/ML FlexTouch Pen and Insulin Pen Needle 32G X 6 MM MISC. She will be away for 5-6 days and she cannot be without the insulin for that long. Patient is asking if there is anyway way she can get a emergency refill to the following pharmacy. Please follow up with patient.   [facility] [address], [place] - [address] Phone: [phone]  Fax: [phone]

## 2024-01-26 NOTE — Telephone Encounter (Signed)
 Copied from CRM [id number]. Topic: Clinical - Medication Refill >> Jan 26, 2024 12:11 PM Higinio Roger wrote: Most Recent Primary Care Visit:  Provider: Della Goo F  Department: [facility]  Visit Type: OFFICE VISIT  Date: 08/07/2023  Medication: insulin degludec (TRESIBA FLEXTOUCH) 100 UNIT/ML FlexTouch Pen  Insulin Pen Needle 32G X 6 MM MISC    Has the patient contacted their pharmacy? No (Agent: If no, request that the patient contact the pharmacy for the refill. 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 Yes If no, delete pharmacy and type the correct one.  This is the patient's preferred pharmacy:   [facility] [address] [place] - [address] [place] [address] Phone: [phone] Fax: [phone]   Has the prescription been filled recently? Yes  Is the patient out of the medication? Yes she left it at home by accident  Has the patient been seen for an appointment in the last year OR does the patient have an upcoming appointment? Yes  Can we respond through MyChart? No. Call [phone]  Agent: Please be advised that Rx refills may take up to 3 business days. We ask that you follow-up with your pharmacy.

## 2024-01-26 NOTE — Telephone Encounter (Signed)
 Pt called in states tresiba was sent to [facility] in [place] but is supposed to go to  [facility] [address] [place] - [address] Phone: [phone]  Fax: [phone]    She is out of town and left her med at home,  here and needs to pick this up here.

## 2024-03-27 ENCOUNTER — Ambulatory Visit
Admission: EM | Admit: 2024-03-27 | Discharge: 2024-03-27 | Disposition: A | Discharge status: Home / Self Care | Attending: Family Medicine | Admitting: Family Medicine

## 2024-03-27 DIAGNOSIS — S39012A Strain of muscle, fascia and tendon of lower back, initial encounter: Secondary | ICD-10-CM | POA: Diagnosis not present

## 2024-03-27 MED ORDER — METHOCARBAMOL 500 MG PO TABS: 500.0000 mg | ORAL_TABLET | Freq: Two times a day (BID) | ORAL | 0 refills | Status: AC

## 2024-03-27 MED ORDER — KETOROLAC TROMETHAMINE 60 MG/2ML IM SOLN
30.0000 mg | Freq: Once | INTRAMUSCULAR | Status: AC
  Administered 2024-03-27: 30 mg via INTRAMUSCULAR

## 2024-03-27 MED ORDER — KETOROLAC TROMETHAMINE 30 MG/ML IJ SOLN: 30.0000 mg | Freq: Once | INTRAMUSCULAR | Status: DC

## 2024-03-27 MED ORDER — NAPROXEN 500 MG PO TABS: 500.0000 mg | ORAL_TABLET | Freq: Two times a day (BID) | ORAL | 0 refills | Status: AC

## 2024-03-27 NOTE — ED Provider Notes (Signed)
 [facility]    CSN: [id number] Arrival date & time: 03/27/24  1619      History   Chief Complaint Chief Complaint  Patient presents with   Back Pain    HPI  HPI Destiny Luna is a 53 y.o. female.   Destiny Luna presents for mid to lower back pain that started 2 week ago.  Taking Tylenol  without relief.  No other treatments tried. Pain usually goes away after a few days. Gets similar pain every "4th blue moon." Has to pull her work bags and feels the pain in her back.  Pain described as sharp.  It does not radiate down her legs or upper back.  There has been no weakness, numbness, tingling, leg pain, leg weakness, incontinence, perianal numbness. Continues to have  pain with movement.  Denies recent falls, trauma or injury.   Past Medical History:  Diagnosis Date   Diabetes mellitus without complication [facility])     Patient Active Problem List   Diagnosis Date Noted   Mixed hyperlipidemia 12/07/2023   Morbid obesity (HCC) 05/01/2023   Diabetes mellitus (HCC) 05/01/2023    Past Surgical History:  Procedure Laterality Date   CESAREAN SECTION     HERNIA REPAIR      OB History   No obstetric history on file.      Home Medications    Prior to Admission medications   Medication Sig Start Date End Date Taking? Authorizing Provider  insulin  degludec (TRESIBA  FLEXTOUCH) 100 UNIT/ML FlexTouch Pen Inject 60 Units into the skin daily. 01/26/24  Yes Quinton Buckler, FNP  methocarbamol (ROBAXIN) 500 MG tablet Take 1 tablet (500 mg total) by mouth 2 (two) times daily. 03/27/24  Yes Tessah Patchen, DO  naproxen (NAPROSYN) 500 MG tablet Take 1 tablet (500 mg total) by mouth 2 (two) times daily with a meal. 03/27/24  Yes Yanci Bachtell, DO  tirzepatide  (MOUNJARO ) 7.5 MG/0.5ML Pen Inject 7.5 mg into the skin once a week. 08/16/23  Yes Quinton Buckler, FNP  glimepiride  (AMARYL ) 2 MG tablet Take 1 tablet (2 mg total) by mouth daily before breakfast. Patient not taking: Reported  on 10/13/2023 08/15/23   Quinton Buckler, FNP  insulin  degludec (TRESIBA  FLEXTOUCH) 100 UNIT/ML FlexTouch Pen Inject 60 Units into the skin daily. 01/26/24   Quinton Buckler, FNP  Insulin  Pen Needle 32G X 6 MM MISC 1 each by Does not apply route daily. 01/26/24   Quinton Buckler, FNP  losartan  (COZAAR ) 25 MG tablet Take 1 tablet (25 mg total) by mouth daily. 05/03/23   Pender, Julie F, FNP  terbinafine  (LAMISIL ) 1 % cream Apply 1 Application topically daily. Patient not taking: Reported on 10/13/2023 07/24/23   Mardene Shake, FNP  fluticasone  (FLONASE ) 50 MCG/ACT nasal spray Place 2 sprays into both nostrils daily. 03/27/20 09/26/20  Ethlyn Herd, MD  levocetirizine (XYZAL ) 5 MG tablet Take 1 tablet (5 mg total) by mouth every evening. 03/27/20 09/26/20  Ethlyn Herd, MD    Family History Family History  Problem Relation Age of Onset   Diabetes Mother    CAD Father        CABG x 3   Stroke Father    Diabetes Father     Social History Social History   Tobacco Use   Smoking status: Former    Current packs/day: 0.25    Average packs/day: 0.3 packs/day for 4.0 years (1.0 ttl pk-yrs)    Types: Cigarettes   Smokeless tobacco: Never  Tobacco comments:    quit for 8 years and restarted  Vaping Use   Vaping status: Never Used  Substance Use Topics   Alcohol use: Not Currently    Comment: rarely   Drug use: Never     Allergies   Patient has no known allergies.   Review of Systems Review of Systems: egative unless otherwise stated in HPI.      Physical Exam Triage Vital Signs ED Triage Vitals  Encounter Vitals Group     BP      Systolic BP Percentile      Diastolic BP Percentile      Pulse      Resp      Temp      Temp src      SpO2      Weight      Height      Head Circumference      Peak Flow      Pain Score      Pain Loc      Pain Education      Exclude from Growth Chart    No data found.  Updated Vital Signs BP 118/72 (BP Location: Right Wrist)    Pulse (!) 109   Temp 98.6 F (37 C) (Oral)   Resp 16   SpO2 96%   Visual Acuity Right Eye Distance:   Left Eye Distance:   Bilateral Distance:    Right Eye Near:   Left Eye Near:    Bilateral Near:     Physical Exam GEN: well appearing female in no acute distress  CVS: well perfused  RESP: speaking in full sentences without pause, no respiratory distress  MSK:  Spine: - Inspection: no gross deformity or asymmetry, swelling or ecchymosis. No skin changes  - Palpation: No TTP over the spinous processes of the C, T and L-spine, left lumbar paraspinal muscles, no SI joint tenderness bilaterally - ROM: good  active ROM of the lumbar spine in flexion and extension - Strength: at baseline  - Neuro: sensation intact  - Special testing: Negative straight leg raise SKIN: warm, dry, no overly skin rash or erythema    UC Treatments / Results  Labs (all labs ordered are listed, but only abnormal results are displayed) Labs Reviewed - No data to display  EKG   Radiology No results found.   Procedures Procedures (including critical care time)  Medications Ordered in UC Medications  ketorolac (TORADOL) injection 30 mg (30 mg Intramuscular Given 03/27/24 1712)    Initial Impression / Assessment and Plan / UC Course  I have reviewed the triage vital signs and the nursing notes.  Pertinent labs & imaging results that were available during my care of the patient were reviewed by me and considered in my medical decision making (see chart for details).      Pt is a 53 y.o.  female with acute on chronic back pain for the past 2 weeks.  Patient to gradually return to normal activities, as tolerated and continue ordinary activities within the limits permitted by pain.  Imaging deferred as I doubt bony injury.  Patient has hypertonicity and tenderness of the left lower lumbar spine.  Discussed steroids versus Toradol versus oral medications.  Patient is agreeable to Toradol 30 mg IM.   Prescribed Naproxen sodium  and muscle relaxer  for pain relief.  Advised patient to avoid other NSAIDs while taking Naprosyn. Tylenol  and Lidocaine patches PRN for multimodal pain relief. Counseled patient  on red flag symptoms and when to seek immediate care.  No red flags suggesting cauda equina syndrome or progressive major motor weakness. Patient to follow up with orthopedic provider if symptoms do not improve with conservative treatment.  Return and ED precautions given.    Discussed MDM, treatment plan and plan for follow-up with patient who agrees with plan.   Final Clinical Impressions(s) / UC Diagnoses   Final diagnoses:  Strain of lumbar region, initial encounter     Discharge Instructions      Stop by the pharmacy to pick up your prescriptions.  If medication was prescribed, stop by the pharmacy to pick up your prescriptions.  For your  pain, Take 1500 mg Tylenol  twice a day, take muscle relaxer (methocarbamol/Robaxin) twice a day, take Naprosyn twice a day,  as needed for pain. Apply warm compresses intermittently, as needed.  As pain recedes, begin normal activities slowly as tolerated.  Follow up with primary care provider or an orthopedic provider, if symptoms persist.  Watch for worsening symptoms such as an increasing weakness or loss of sensation, increasing pain and/or the loss of bladder or bowel function. Should any of these occur, go to the emergency department immediately.         ED Prescriptions     Medication Sig Dispense Auth. Provider   naproxen (NAPROSYN) 500 MG tablet Take 1 tablet (500 mg total) by mouth 2 (two) times daily with a meal. 30 tablet Julita Ozbun, DO   methocarbamol (ROBAXIN) 500 MG tablet Take 1 tablet (500 mg total) by mouth 2 (two) times daily. 20 tablet Adron Geisel, DO      PDMP not reviewed this encounter.   Halona Amstutz, DO 03/27/24 1715

## 2024-03-27 NOTE — Discharge Instructions (Signed)
 Stop by the pharmacy to pick up your prescriptions.  If medication was prescribed, stop by the pharmacy to pick up your prescriptions.  For your  pain, Take 1500 mg Tylenol  twice a day, take muscle relaxer (methocarbamol/Robaxin) twice a day, take Naprosyn twice a day,  as needed for pain. Apply warm compresses intermittently, as needed.  As pain recedes, begin normal activities slowly as tolerated.  Follow up with primary care provider or an orthopedic provider, if symptoms persist.  Watch for worsening symptoms such as an increasing weakness or loss of sensation, increasing pain and/or the loss of bladder or bowel function. Should any of these occur, go to the emergency department immediately.

## 2024-03-27 NOTE — ED Triage Notes (Signed)
 Back pain x 1 week. Middle of back.

## 2024-04-03 ENCOUNTER — Encounter: Payer: Self-pay | Admitting: Nurse Practitioner

## 2024-04-03 ENCOUNTER — Ambulatory Visit (INDEPENDENT_AMBULATORY_CARE_PROVIDER_SITE_OTHER): Admitting: Nurse Practitioner

## 2024-04-03 VITALS — BP 132/78 | HR 121 | Temp 99.3°F | Resp 18 | Ht 65.0 in | Wt 264.3 lb

## 2024-04-03 DIAGNOSIS — E1165 Type 2 diabetes mellitus with hyperglycemia: Secondary | ICD-10-CM

## 2024-04-03 DIAGNOSIS — Z130 Encounter for screening for diseases of the blood and blood-forming organs and certain disorders involving the immune mechanism: Secondary | ICD-10-CM

## 2024-04-03 DIAGNOSIS — Z794 Long term (current) use of insulin: Secondary | ICD-10-CM

## 2024-04-03 DIAGNOSIS — E782 Mixed hyperlipidemia: Secondary | ICD-10-CM | POA: Diagnosis not present

## 2024-04-03 DIAGNOSIS — Z1211 Encounter for screening for malignant neoplasm of colon: Secondary | ICD-10-CM

## 2024-04-03 NOTE — Progress Notes (Signed)
 BP 132/78   Pulse (!) 121   Temp 99.3 F (37.4 C)   Resp 18   Ht 5\' 5"  (1.651 m)   Wt 264 lb 4.8 oz (119.9 kg)   SpO2 97%   BMI 43.98 kg/m    Subjective:    Patient ID: Destiny Luna, female    DOB: [birth year], 53 y.o.   MRN: [id number]  HPI: Destiny Luna is a 53 y.o. female  Chief Complaint  Patient presents with   Medical Management of Chronic Issues   Diabetes   Allergic Rhinitis     W/ skin irritation    Discussed the use of AI scribe software for clinical note transcription with the patient, who gave verbal consent to proceed.  History of Present Illness [name] is a 53 year old female with type 2 diabetes who presents for diabetes management.  She manages her type 2 diabetes with Tresiba , taking 30 units daily, and adjusts her insulin  dose based on her blood sugar levels, which she monitors regularly. Insulin  is typically taken two to three times a week, depending on her morning blood sugar readings. Recently, she experienced a morning blood sugar level of 129 mg/dL after consuming watermelon juice the night before. Her blood sugar levels are generally well-controlled, often waking up with levels in the 70s to 100s mg/dL.  She was previously on Mounjaro  7.5 mg weekly but did not tolerate it well and discontinued its use. She later resumed Mounjaro , increasing the dose to 10 mg weekly, which she is currently tolerating. The medication has helped reduce her appetite significantly, and she is hopeful to see further weight loss. Her current weight is 264 pounds with a BMI of 43.98.  She has hyperlipidemia and obesity, which are being managed alongside her diabetes. Her last microalbumin was elevated, prompting the initiation of losartan  25 mg daily to protect her kidneys. However she is no longer taking losartan .   She reports a recent visit to the eye doctor where she was informed of the need for cataract surgery in both eyes. 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 She has been out of work for a week due to this illness.  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 She is considering cataract surgery but is concerned about missing work. Recent illness with symptoms consistent with COVID-19, including stuffiness. No current fever or other acute symptoms.         04/03/2024   10:12 AM 08/07/2023   12:59 PM 06/06/2023    9:50 AM  Depression screen PHQ 2/9  Decreased Interest 0 0 0  Down, Depressed, Hopeless 0 0 1  PHQ - 2 Score 0 0 1  Altered sleeping 0    Tired, decreased energy 0    Change in appetite 0    Feeling bad or failure about yourself  0    Trouble concentrating 0    Moving slowly or fidgety/restless 0    Suicidal thoughts 0    PHQ-9 Score 0    Difficult doing work/chores Not difficult at all      Relevant past medical, surgical, family and social history reviewed and updated as indicated. Interim medical history since our last visit reviewed. Allergies and medications reviewed and updated.  Review of Systems  Constitutional: Negative for fever or weight change.  Respiratory:  Negative for cough and shortness of breath.   Cardiovascular: Negative for chest pain or palpitations.  Gastrointestinal: Negative for abdominal pain, no bowel changes.  Musculoskeletal: Negative for gait problem or joint swelling.  Skin: Negative for rash.  Neurological: Negative for dizziness or headache.  No other specific complaints in a complete review of systems (except as listed in HPI above).      Objective:    BP 132/78   Pulse (!) 121   Temp 99.3 F (37.4 C)   Resp 18   Ht 5\' 5"  (1.651 m)   Wt 264 lb 4.8 oz (119.9 kg)   SpO2 97%   BMI 43.98 kg/m    Wt Readings from Last 3 Encounters:  04/03/24 264 lb 4.8 oz (119.9 kg)  08/07/23 269 lb 12.8  oz (122.4 kg)  06/06/23 270 lb 3.2 oz (122.6 kg)    Physical Exam Physical Exam VITALS: BP- 122/78 MEASUREMENTS: Weight- 264, BMI- 43.98. GENERAL: Alert, cooperative, well developed, no acute distress. HEENT: Normocephalic, normal oropharynx, moist mucous membranes. CHEST: Clear to auscultation bilaterally, no wheezes, rhonchi, or crackles. CARDIOVASCULAR: Normal heart rate and rhythm, S1 and S2 normal without murmurs. ABDOMEN: Soft, non-tender, non-distended, without organomegaly, normal bowel sounds. EXTREMITIES: No cyanosis or edema. NEUROLOGICAL: Cranial nerves grossly intact, moves all extremities without gross motor or sensory deficit.   Results for orders placed or performed in visit on 08/07/23  POCT HgB A1C   Collection Time: 08/07/23  1:27 PM  Result Value Ref Range   Hemoglobin A1C 8.1 (A) 4.0 - 5.6 %   HbA1c POC (<> result, manual entry)     HbA1c, POC (prediabetic range)     HbA1c, POC (controlled diabetic range)         Assessment & Plan:   Problem List Items Addressed This Visit       Endocrine   Diabetes mellitus (HCC) - Primary   Relevant Medications   tirzepatide  (MOUNJARO ) 10 MG/0.5ML Pen   Other Relevant Orders   Comprehensive metabolic panel with GFR   Hemoglobin A1c   Microalbumin / creatinine urine ratio     Other   Morbid obesity (HCC)   Relevant Medications   tirzepatide  (MOUNJARO ) 10 MG/0.5ML Pen   Mixed hyperlipidemia   Relevant Orders   Comprehensive metabolic panel with GFR   Lipid panel   Other Visit Diagnoses       Screening for deficiency anemia       Relevant Orders   CBC with Differential/Platelet     Screening for colon cancer       Relevant Orders   Cologuard        Assessment and Plan Assessment & Plan Type 2 diabetes mellitus Type 2 diabetes mellitus with recent medication adjustment. Mounjaro  increased to 10 mg weekly. Blood glucose levels are variable, with morning readings around 129 mg/dL, influenced by dietary  intake. Insulin  is used intermittently, 2-3 times weekly, with doses adjusted based on glucose readings. She is actively managing her condition through medication and dietary monitoring. - Continue Mounjaro  10 mg weekly. - Adjust Tresiba  dosage based on blood glucose readings, approximately 30 units daily - Complete Novo Nordisk patient assistance program paperwork to reduce insulin  cost.  HLD -getting lipid panel today   Obesity Obesity with a weight of 264 pounds and a BMI of 43.98. She is actively managing weight through medication and dietary monitoring, aiming to reduce weight below 250 pounds. - Encourage lifestyle modifications to support weight loss.  Cataracts Bilateral cataracts  requiring surgical intervention. Advised to undergo surgery one eye at a time, with a recovery period of 2-3 days per eye. She is considering timing due to work commitments and has a consultation scheduled. - Schedule cataract surgery consultation. - Discuss timing and logistics of surgery with ophthalmologist.        Follow up plan: Return in 4 months (on 08/04/2024) for CPE w/ pap, follow up.

## 2024-04-04 ENCOUNTER — Encounter: Payer: Self-pay | Admitting: Nurse Practitioner

## 2024-04-04 LAB — COMPREHENSIVE METABOLIC PANEL WITH GFR
AG Ratio: 1.3 (calc) (ref 1.0–2.5)
ALT: 17 U/L (ref 6–29)
AST: 16 U/L (ref 10–35)
Albumin: 4.1 g/dL (ref 3.6–5.1)
Alkaline phosphatase (APISO): 102 U/L (ref 37–153)
BUN/Creatinine Ratio: 20 (calc) (ref 6–22)
BUN: 26 mg/dL — ABNORMAL HIGH (ref 7–25)
CO2: 27 mmol/L (ref 20–32)
Calcium: 9.8 mg/dL (ref 8.6–10.4)
Chloride: 104 mmol/L (ref 98–110)
Creat: 1.28 mg/dL — ABNORMAL HIGH (ref 0.50–1.03)
Globulin: 3.2 g/dL (ref 1.9–3.7)
Glucose, Bld: 196 mg/dL — ABNORMAL HIGH (ref 65–99)
Potassium: 5.8 mmol/L — ABNORMAL HIGH (ref 3.5–5.3)
Sodium: 139 mmol/L (ref 135–146)
Total Bilirubin: 0.3 mg/dL (ref 0.2–1.2)
Total Protein: 7.3 g/dL (ref 6.1–8.1)
eGFR: 51 mL/min/{1.73_m2} — ABNORMAL LOW (ref >=60)

## 2024-04-04 LAB — CBC WITH DIFFERENTIAL/PLATELET
Absolute Lymphocytes: 3397 {cells}/uL (ref 850–3900)
Absolute Monocytes: 623 {cells}/uL (ref 200–950)
Basophils Absolute: 38 {cells}/uL (ref 0–200)
Basophils Relative: 0.5 %
Eosinophils Absolute: 122 {cells}/uL (ref 15–500)
Eosinophils Relative: 1.6 %
HCT: 41.2 % (ref 35.0–45.0)
Hemoglobin: 12.9 g/dL (ref 11.7–15.5)
MCH: 25 pg — ABNORMAL LOW (ref 27.0–33.0)
MCHC: 31.3 g/dL — ABNORMAL LOW (ref 32.0–36.0)
MCV: 79.8 fL — ABNORMAL LOW (ref 80.0–100.0)
MPV: 10.4 fL (ref 7.5–12.5)
Monocytes Relative: 8.2 %
Neutro Abs: 3420 {cells}/uL (ref 1500–7800)
Neutrophils Relative %: 45 %
Platelets: 341 10*3/uL (ref 140–400)
RBC: 5.16 10*6/uL — ABNORMAL HIGH (ref 3.80–5.10)
RDW: 15.2 % — ABNORMAL HIGH (ref 11.0–15.0)
Total Lymphocyte: 44.7 %
WBC: 7.6 10*3/uL (ref 3.8–10.8)

## 2024-04-04 LAB — LIPID PANEL
Cholesterol: 199 mg/dL (ref <200)
HDL: 35 mg/dL — ABNORMAL LOW (ref >=50)
LDL Cholesterol (Calc): 125 mg/dL — ABNORMAL HIGH
Non-HDL Cholesterol (Calc): 164 mg/dL — ABNORMAL HIGH (ref <130)
Total CHOL/HDL Ratio: 5.7 (calc) — ABNORMAL HIGH (ref <5.0)
Triglycerides: 255 mg/dL — ABNORMAL HIGH (ref <150)

## 2024-04-04 LAB — HEMOGLOBIN A1C
Hgb A1c MFr Bld: 7.4 % — ABNORMAL HIGH (ref <5.7)
Mean Plasma Glucose: 166 mg/dL
eAG (mmol/L): 9.2 mmol/L

## 2024-04-04 LAB — MICROALBUMIN / CREATININE URINE RATIO
Creatinine, Urine: 128 mg/dL (ref 20–275)
Microalb Creat Ratio: 317 mg/g{creat} — ABNORMAL HIGH (ref <30)
Microalb, Ur: 40.6 mg/dL

## 2024-04-05 ENCOUNTER — Other Ambulatory Visit (HOSPITAL_COMMUNITY): Payer: Self-pay

## 2024-04-05 ENCOUNTER — Telehealth: Payer: Self-pay

## 2024-04-05 NOTE — Addendum Note (Signed)
 Addended by: Danyelle Brookover F on: 04/05/2024 09:27 AM   Modules accepted: Orders

## 2024-04-05 NOTE — Telephone Encounter (Signed)
 Patient has Nurse, learning disability and does not qualify for patient assistance.  I ran a test claim and a 30 day supply is $97.62.  There is a coupon the patient can register for that will knock it down to $35/month.  I have included a link and will send to patient.  https://www.[employer].com/diabetes/products/tresiba /savings-offer.html

## 2024-04-08 ENCOUNTER — Telehealth: Payer: Self-pay

## 2024-04-08 NOTE — Telephone Encounter (Unsigned)
 Copied from CRM [id number]. Topic: General - Other >> Apr 08, 2024 10:54 AM Hassie Lint wrote: Reason for CRM: Talisa from [facility] called to speak with someone at the office in regards to the claim submitted for the patient. Patient ID is: [id number]  Talisa or any agent can be contact [phone]

## 2024-04-09 ENCOUNTER — Other Ambulatory Visit: Payer: Self-pay | Admitting: Pharmacist

## 2024-04-09 ENCOUNTER — Encounter: Payer: Self-pay | Admitting: Pharmacist

## 2024-04-09 DIAGNOSIS — E1165 Type 2 diabetes mellitus with hyperglycemia: Secondary | ICD-10-CM

## 2024-04-09 NOTE — Patient Instructions (Signed)
 It was a pleasure speaking with you today!   The following are the web addresses for those savings cards that we discussed.   For Tresiba :   [email or web address]://www.[email or web address]/products/tresiba /savings-offer.html   For Mounjaro :   [email or web address]     Once you download these savings cards, please bring both to your pharmacy and ask the pharmacy to apply these savings after your insurance.   If you have any further medication questions for me in the future, please feel free to give me a call.   Arthur Lash, PharmD, [facility] [phone]

## 2024-04-09 NOTE — Progress Notes (Signed)
   04/09/2024 Name: Destiny Luna MRN: [id number] DOB: 1971/04/14  Chief Complaint  Patient presents with   Medication Assistance    [name] is a 53 y.o. year old female who was referred to the pharmacist by their PCP for assistance in managing medication access for Tresiba .  Outreach to patient today by telephone.  Patient expresses interest in any available cost savings available for her Tresiba  insulin  and Mounjaro  prescriptions.  Counsel patient on available manufacturer savings cards. Send patient a MyChart message with links to download each savings card, as requested.  Patient denies further medication questions or concerns today   Follow Up Plan:   Provide patient with contact information for clinic pharmacist to contact if needed in future for medication questions/concerns   Arthur Lash, PharmD, [facility] [phone]

## 2024-04-24 ENCOUNTER — Telehealth: Payer: Self-pay | Admitting: Podiatry

## 2024-04-26 NOTE — Telephone Encounter (Signed)
 s/w pat. she emailed last intermitten fmla verbiage. I faxed [place] [phone] form. 03/20/24-11/27/24 effective dates

## 2024-05-08 ENCOUNTER — Other Ambulatory Visit: Payer: Self-pay | Admitting: Nurse Practitioner

## 2024-05-09 NOTE — Telephone Encounter (Signed)
 Requested medication (s) are due for refill today: yes  Requested medication (s) are on the active medication list: yes  Last refill:  04/03/24  Future visit scheduled: yes  Notes to clinic:  Unable to refill per protocol, last refill by another provider.      Requested Prescriptions  Pending Prescriptions Disp Refills   MOUNJARO  10 MG/0.5ML Pen [Pharmacy Med Name: Mounjaro  10 MG/0.5ML Subcutaneous Solution Pen-injector] 12 mL 0    Sig: INJECT 10MG  SUBCUTANEOUSLY ONCE A WEEK     Off-Protocol Failed - 05/09/2024 10:57 AM      Failed - Medication not assigned to a protocol, review manually.      Passed - Valid encounter within last 12 months    Recent Outpatient Visits           1 month ago Type 2 diabetes mellitus with hyperglycemia, with long-term current use of insulin  [facility])   [facility] Quinton Buckler, FNP       Future Appointments             In 2 months Abram Hoguet, Monalisa Angles, FNP [facility]

## 2024-05-14 MED ORDER — MOUNJARO 10 MG/0.5ML ~~LOC~~ SOAJ: 10.0000 mg | SUBCUTANEOUS | 0 refills | Status: DC

## 2024-05-14 NOTE — Telephone Encounter (Signed)
 Pharmacy requesting new prescription of Monjaro. Last refill : NP Abram Hoguet 03/2024  Please send to pharmacy noted on file.

## 2024-05-14 NOTE — Addendum Note (Signed)
 Addended by: Rudy Luhmann on: 05/14/2024 02:04 PM   Modules accepted: Orders

## 2024-05-14 NOTE — Telephone Encounter (Signed)
 Patient is calling checking on the status of why her MOUNJARO  10 MG/0.5ML Pen was refused. As per patient chart Request refused: Prescriber not at this practice. Patient confirmed PCP has prescribed medication and caller would like a follow up call today.

## 2024-05-23 ENCOUNTER — Telehealth: Payer: Self-pay | Admitting: Podiatry

## 2024-05-23 NOTE — Telephone Encounter (Signed)
 Patient has a sore on left big toe and thinks it's infected. She wants to see you ONLY not another provider. Can you send a script for antibiotic to  [facility] [address], [place] - [address] Phone: [phone]  Fax: [phone]

## 2024-05-24 ENCOUNTER — Ambulatory Visit (INDEPENDENT_AMBULATORY_CARE_PROVIDER_SITE_OTHER): Admitting: Podiatry

## 2024-05-24 ENCOUNTER — Encounter: Payer: Self-pay | Admitting: Podiatry

## 2024-05-24 ENCOUNTER — Ambulatory Visit (INDEPENDENT_AMBULATORY_CARE_PROVIDER_SITE_OTHER)

## 2024-05-24 ENCOUNTER — Other Ambulatory Visit: Payer: Self-pay | Admitting: Podiatry

## 2024-05-24 DIAGNOSIS — L97522 Non-pressure chronic ulcer of other part of left foot with fat layer exposed: Secondary | ICD-10-CM

## 2024-05-24 DIAGNOSIS — E08621 Diabetes mellitus due to underlying condition with foot ulcer: Secondary | ICD-10-CM | POA: Diagnosis not present

## 2024-05-24 MED ORDER — SULFAMETHOXAZOLE-TRIMETHOPRIM 800-160 MG PO TABS: 1.0000 | ORAL_TABLET | Freq: Two times a day (BID) | ORAL | 0 refills | Status: DC

## 2024-05-24 MED ORDER — GENTAMICIN SULFATE 0.1 % EX CREA: 1.0000 | TOPICAL_CREAM | Freq: Two times a day (BID) | CUTANEOUS | 1 refills | Status: AC

## 2024-05-24 NOTE — Addendum Note (Signed)
 Addended by: NICHOLAUS MARJORIE SAILOR on: 05/24/2024 12:25 PM   Modules accepted: Orders

## 2024-05-24 NOTE — Progress Notes (Signed)
   No chief complaint on file.   Subjective:  53 y.o. female with PMHx of diabetes mellitus, last A1c 04/03/2024 was 7.4, presenting today for new onset of an ulcer to the left great toe.  She has only noticed it for about 1 week now.  Presenting for further treatment and evaluation   Past Medical History:  Diagnosis Date   Diabetes mellitus without complication (HCC)     Past Surgical History:  Procedure Laterality Date   CESAREAN SECTION     HERNIA REPAIR      No Known Allergies   Objective/Physical Exam General: The patient is alert and oriented x3 in no acute distress.  Dermatology:  Ulcer noted to the distal tip of the left great toe measuring approximately 0.6 x 0.6 x 1.2 cm in depth.  It does extend down to the level of the distal phalanx  Vascular: Palpable pedal pulses bilaterally. No edema or erythema noted. Capillary refill within normal limits.  Neurological: Light touch and protective threshold diminished bilaterally.   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  No prior amputations.  Patient ambulatory  Radiographic exam LT foot 05/24/2024: No osseous erosions noted.  Overall the foot is stable.  No significant change since last x-rays taken  Assessment: 1.  Ulcer left fifth toe; resolved 2. diabetes mellitus w/ peripheral neuropathy 3.  Partial-thickness skin fissure plantar aspect of the left forefoot  Plan of Care:  -Patient was evaluated.  X-rays reviewed - Medically necessary excisional debridement including  deep muscle and fascial tissue was performed today using a tissue nipper.  Excisional debridement of the necrotic nonviable tissue down to healthier bleeding viable tissue was performed with postdebridement measurement same as pre- -Cultures taken and sent to pathology for culture and sensitivity -Prescription for Bactrim DS x 10 days -Prescription for gentamicin  cream apply twice daily with a light  dressing -Patient has a postop shoe at home.  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 Thresa EMERSON Sar, DPM    [address] [place], [place] [address]                Office [phone]  Fax [phone]

## 2024-05-26 ENCOUNTER — Ambulatory Visit: Payer: Self-pay | Admitting: Podiatry

## 2024-05-29 ENCOUNTER — Telehealth: Payer: Self-pay | Admitting: Podiatry

## 2024-05-29 NOTE — Telephone Encounter (Signed)
 Left Foot is Swollen in pain. Requests stronger antibiotic she believes current is working a slow rate.

## 2024-05-30 ENCOUNTER — Ambulatory Visit: Payer: Self-pay

## 2024-05-30 ENCOUNTER — Ambulatory Visit: Admitting: Nurse Practitioner

## 2024-05-30 ENCOUNTER — Telehealth: Payer: Self-pay | Admitting: Podiatry

## 2024-05-30 ENCOUNTER — Other Ambulatory Visit: Payer: Self-pay | Admitting: Podiatry

## 2024-05-30 ENCOUNTER — Encounter: Payer: Self-pay | Admitting: Nurse Practitioner

## 2024-05-30 VITALS — BP 114/72 | HR 88 | Temp 97.7°F | Resp 16 | Ht 65.0 in | Wt 263.7 lb

## 2024-05-30 DIAGNOSIS — E11621 Type 2 diabetes mellitus with foot ulcer: Secondary | ICD-10-CM

## 2024-05-30 DIAGNOSIS — L97529 Non-pressure chronic ulcer of other part of left foot with unspecified severity: Secondary | ICD-10-CM

## 2024-05-30 LAB — WOUND CULTURE
MICRO NUMBER:: 16643560
SPECIMEN QUALITY:: ADEQUATE

## 2024-05-30 LAB — HOUSE ACCOUNT TRACKING

## 2024-05-30 MED ORDER — LEVOFLOXACIN 750 MG PO TABS: 750.0000 mg | ORAL_TABLET | Freq: Every day | ORAL | 0 refills | Status: DC

## 2024-05-30 NOTE — Telephone Encounter (Signed)
 Patient called in to check on the status of request for a stronger antibiotic

## 2024-05-30 NOTE — Progress Notes (Signed)
 BP 114/72   Pulse 88   Temp 97.7 F (36.5 C)   Resp 16   Ht 5' 5 (1.651 m)   Wt 263 lb 11.2 oz (119.6 kg)   SpO2 98%   BMI 43.88 kg/m    Subjective:    Patient ID: Destiny Luna, female    DOB: [birth year], 53 y.o.   MRN: [id number]  HPI: Destiny Luna is a 53 y.o. female  Chief Complaint  Patient presents with   ucler    Left foot big toe, seen podiatry on 6/27 given antibiotic, still no better.  Looks like foot Dr. Schuyler another antibiotic but pt scared it is osteomyelitis and wants referral to ID    Discussed the use of AI scribe software for clinical note transcription with the patient, who gave verbal consent to proceed.  History of Present Illness Destiny Luna is a 53 year old female with a history of foot ulcers who presents with a swollen and painful left great toe.  She was seen by podiatry on May 24, 2024, for an ulcer on her left fifth toe. Initially, she was prescribed Bactrim for ten days and gentamicin  cream to apply twice daily with a light dressing. Despite this treatment, she experienced swelling and pain in her left foot and requested a stronger antibiotic.  On May 30, 2024, she was prescribed Levaquin 750 mg for ten days.  An x-ray of her foot was done on May 24, 2024, but the results have not been reviewed yet. While the swelling initially decreased, the drainage from the sore did not lessen, which concerned her.  She attempted to return to work but found it too painful to continue, leading her to call in sick. The pain is localized to the toe. No systemic symptoms such as fever, chills, or feeling unwell. She has experienced similar issues with this foot and toe in the past, though her blood sugar levels have been stable.  She has been trying to obtain the new antibiotic for the past three days and plans to pick it up after the visit.         04/03/2024   10:12 AM 08/07/2023   12:59 PM 06/06/2023    9:50 AM  Depression screen PHQ 2/9   Decreased Interest 0 0 0  Down, Depressed, Hopeless 0 0 1  PHQ - 2 Score 0 0 1  Altered sleeping 0    Tired, decreased energy 0    Change in appetite 0    Feeling bad or failure about yourself  0    Trouble concentrating 0    Moving slowly or fidgety/restless 0    Suicidal thoughts 0    PHQ-9 Score 0    Difficult doing work/chores Not difficult at all      Relevant past medical, surgical, family and social history reviewed and updated as indicated. Interim medical history since our last visit reviewed. Allergies and medications reviewed and updated.  Review of Systems  Ten systems reviewed and is negative except as mentioned in HPI      Objective:     BP 114/72   Pulse 88   Temp 97.7 F (36.5 C)   Resp 16   Ht 5' 5 (1.651 m)   Wt 263 lb 11.2 oz (119.6 kg)   SpO2 98%   BMI 43.88 kg/m    Wt Readings from Last 3 Encounters:  05/30/24 263 lb 11.2 oz (119.6 kg)  05/24/24 264 lb 4.8 oz (119.9 kg)  04/03/24 264 lb 4.8 oz (119.9 kg)    Physical Exam Physical Exam GENERAL: Alert, cooperative, well developed, no acute distress. HEENT: Normocephalic, normal oropharynx, moist mucous membranes. CHEST: Clear to auscultation bilaterally, no wheezes, rhonchi, or crackles. CARDIOVASCULAR: Normal heart rate and rhythm, S1 and S2 normal without murmurs. ABDOMEN: Soft, non-tender, non-distended, without organomegaly, normal bowel sounds. EXTREMITIES: No cyanosis or edema. Left big toe ulcer with no visible gas, non-tender to palpation, no ascending erythema, and white discoloration from moisture. NEUROLOGICAL: Cranial nerves grossly intact, moves all extremities without gross motor or sensory deficit.   Results for orders placed or performed in visit on 05/24/24  WOUND CULTURE   Collection Time: 05/24/24 12:23 PM  Result Value Ref Range   MICRO NUMBER: [id number]    SPECIMEN QUALITY: Adequate    SOURCE: WOUND    STATUS: FINAL    GRAM STAIN: Gram-positive cocci in chains     RESULT:      A mix of organisms of questionable significance was recovered on culture and not further identified. (Note: Growth did not detect the presence of S.aureus, beta-hemolytic Streptococci or P.aeruginosa).  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 No systemic involvement or fever. Previously treated with Bactrim and gentamicin  cream without significant improvement. - Prescribed Levaquin 750 mg for 10 days from podiatry. - Order blood work including sed rate, CRP, and CBC to assess for infection and inflammation. - Instruct to start Levaquin 750 mg as prescribed. - Advise to monitor for signs of systemic infection and to go to the ER if symptoms worsen by Monday. - Arrange for a new dressing to be applied to the toe. - Refer to infectious disease specialist Dr. Tillman Hamel in [place] for further management.  Type 2 diabetes mellitus Blood sugar levels are well-controlled. No indication of poor glycemic control contributing to the current foot infection.        Follow up plan: Return if symptoms worsen or fail to improve.

## 2024-05-30 NOTE — Progress Notes (Signed)
 le

## 2024-05-30 NOTE — Telephone Encounter (Signed)
 FYI Only or Action Required?: FYI only for provider.  Patient was last seen in primary care on 04/03/2024 by Gareth Mliss FALCON, FNP. Called Nurse Triage reporting Foot Swelling. Symptoms began x 2 weeks. Interventions attempted: Nothing. Symptoms are: gradually worsening.  Triage Disposition: See HCP Within 4 Hours (Or PCP Triage)  Patient/caregiver understands and will follow disposition?: Yes  **Pt. Will need to be seen in UC/ED as there aren't any available appointments today**                         Copied from CRM [id number]. Topic: Clinical - Red Word Triage >> May 30, 2024  1:49 PM Ivette P wrote: Red Word that prompted transfer to Nurse Triage: Sore on the toe, pain, swelling, pt does not want toe amputated. Has a lot of discharge.    Wanting a referral to see a specialist Reason for Disposition  [1] Looks infected (spreading redness, pus) AND [2] large red area (> 2 in. or 5 cm)    Smaller area on Big toe  Answer Assessment - Initial Assessment Questions 1. APPEARANCE of SORES: What do the sores look like?       Left foot big toe, the area has a sore, with white on top. She states it looks pussy. There is drainage, which is red.    2. NUMBER: How many sores are there?      One   3. SIZE: How big is the largest sore?      She doesn't know   4. LOCATION: Where are the sores located?      Left Big toe  5. ONSET: When did the sores begin?      X 2 weeks   6. TENDER: Does it hurt when you touch it?  (Scale 1-10; or mild, moderate, severe)       No pain  7. CAUSE: What do you think is causing the sores?      Osteomyelitis   8. OTHER SYMPTOMS: Do you have any other symptoms? (e.g., fever, new weakness)     Red, swollen left left, warm to the touch.    Patient wants to be seen today, there are no available appointments for today, the soonest is 7/8.Patient is upset that she cannot be seen today. UC/ED was recommended as the  office will be closed on Friday the 4th.  Protocols used: [employer]

## 2024-05-30 NOTE — Telephone Encounter (Signed)
 The patient was speaking with someone but got disconnected. She expresses extreme concern that an infection may be developing or worsening. She is currently taking sulfamethoxazole-trimethoprim (BACTRIM DS) 800-160 mg tablets but feels the infection is not improving.  She has been trying to contact Dr. Janit via MyChart since Monday but has not received a response.

## 2024-05-31 ENCOUNTER — Emergency Department

## 2024-05-31 ENCOUNTER — Encounter: Payer: Self-pay | Admitting: Intensive Care

## 2024-05-31 ENCOUNTER — Inpatient Hospital Stay
Admission: EM | Admit: 2024-05-31 | Discharge: 2024-06-03 | DRG: 638 | Disposition: A | Discharge status: Home / Self Care | Attending: Obstetrics and Gynecology | Admitting: Obstetrics and Gynecology

## 2024-05-31 ENCOUNTER — Other Ambulatory Visit: Payer: Self-pay

## 2024-05-31 DIAGNOSIS — Z7985 Long-term (current) use of injectable non-insulin antidiabetic drugs: Secondary | ICD-10-CM | POA: Diagnosis not present

## 2024-05-31 DIAGNOSIS — E1142 Type 2 diabetes mellitus with diabetic polyneuropathy: Secondary | ICD-10-CM | POA: Diagnosis present

## 2024-05-31 DIAGNOSIS — Z794 Long term (current) use of insulin: Secondary | ICD-10-CM

## 2024-05-31 DIAGNOSIS — Z833 Family history of diabetes mellitus: Secondary | ICD-10-CM | POA: Diagnosis not present

## 2024-05-31 DIAGNOSIS — Z8249 Family history of ischemic heart disease and other diseases of the circulatory system: Secondary | ICD-10-CM | POA: Diagnosis not present

## 2024-05-31 DIAGNOSIS — N179 Acute kidney failure, unspecified: Secondary | ICD-10-CM | POA: Diagnosis present

## 2024-05-31 DIAGNOSIS — E860 Dehydration: Secondary | ICD-10-CM | POA: Diagnosis present

## 2024-05-31 DIAGNOSIS — Z823 Family history of stroke: Secondary | ICD-10-CM

## 2024-05-31 DIAGNOSIS — E11621 Type 2 diabetes mellitus with foot ulcer: Secondary | ICD-10-CM | POA: Diagnosis present

## 2024-05-31 DIAGNOSIS — L97526 Non-pressure chronic ulcer of other part of left foot with bone involvement without evidence of necrosis: Secondary | ICD-10-CM | POA: Diagnosis present

## 2024-05-31 DIAGNOSIS — E66813 Obesity, class 3: Secondary | ICD-10-CM | POA: Diagnosis present

## 2024-05-31 DIAGNOSIS — Z87891 Personal history of nicotine dependence: Secondary | ICD-10-CM | POA: Diagnosis not present

## 2024-05-31 DIAGNOSIS — Z6841 Body Mass Index (BMI) 40.0 and over, adult: Secondary | ICD-10-CM

## 2024-05-31 DIAGNOSIS — E785 Hyperlipidemia, unspecified: Secondary | ICD-10-CM | POA: Diagnosis present

## 2024-05-31 DIAGNOSIS — E1165 Type 2 diabetes mellitus with hyperglycemia: Secondary | ICD-10-CM | POA: Diagnosis not present

## 2024-05-31 DIAGNOSIS — R591 Generalized enlarged lymph nodes: Secondary | ICD-10-CM | POA: Diagnosis present

## 2024-05-31 DIAGNOSIS — M869 Osteomyelitis, unspecified: Principal | ICD-10-CM | POA: Diagnosis present

## 2024-05-31 DIAGNOSIS — E1169 Type 2 diabetes mellitus with other specified complication: Secondary | ICD-10-CM | POA: Diagnosis present

## 2024-05-31 DIAGNOSIS — L03116 Cellulitis of left lower limb: Secondary | ICD-10-CM | POA: Diagnosis present

## 2024-05-31 DIAGNOSIS — E119 Type 2 diabetes mellitus without complications: Secondary | ICD-10-CM

## 2024-05-31 DIAGNOSIS — L97519 Non-pressure chronic ulcer of other part of right foot with unspecified severity: Secondary | ICD-10-CM | POA: Diagnosis not present

## 2024-05-31 DIAGNOSIS — M86171 Other acute osteomyelitis, right ankle and foot: Secondary | ICD-10-CM | POA: Diagnosis not present

## 2024-05-31 LAB — CBC WITH DIFFERENTIAL/PLATELET
Abs Immature Granulocytes: 0.02 K/uL (ref 0.00–0.07)
Absolute Lymphocytes: 3832 {cells}/uL (ref 850–3900)
Absolute Monocytes: 999 {cells}/uL — ABNORMAL HIGH (ref 200–950)
Basophils Absolute: 49 {cells}/uL (ref 0–200)
Basophils Absolute: 0 K/uL (ref 0.0–0.1)
Basophils Relative: 0.5 %
Basophils Relative: 0 %
Eosinophils Absolute: 126 {cells}/uL (ref 15–500)
Eosinophils Absolute: 0.1 K/uL (ref 0.0–0.5)
Eosinophils Relative: 1.3 %
Eosinophils Relative: 1 %
HCT: 38.8 % (ref 35.0–45.0)
HCT: 37.6 % (ref 36.0–46.0)
Hemoglobin: 12.4 g/dL (ref 11.7–15.5)
Hemoglobin: 11.7 g/dL — ABNORMAL LOW (ref 12.0–15.0)
Immature Granulocytes: 0 %
Lymphocytes Relative: 36 %
Lymphs Abs: 3.5 K/uL (ref 0.7–4.0)
MCH: 26.1 pg — ABNORMAL LOW (ref 27.0–33.0)
MCH: 25.7 pg — ABNORMAL LOW (ref 26.0–34.0)
MCHC: 32 g/dL (ref 32.0–36.0)
MCHC: 31.1 g/dL (ref 30.0–36.0)
MCV: 81.7 fL (ref 80.0–100.0)
MCV: 82.5 fL (ref 80.0–100.0)
MPV: 10.2 fL (ref 7.5–12.5)
Monocytes Absolute: 0.9 K/uL (ref 0.1–1.0)
Monocytes Relative: 10.3 %
Monocytes Relative: 9 %
Neutro Abs: 4695 {cells}/uL (ref 1500–7800)
Neutro Abs: 5 K/uL (ref 1.7–7.7)
Neutrophils Relative %: 48.4 %
Neutrophils Relative %: 54 %
Platelets: 331 Thousand/uL (ref 140–400)
Platelets: 304 K/uL (ref 150–400)
RBC: 4.75 Million/uL (ref 3.80–5.10)
RBC: 4.56 MIL/uL (ref 3.87–5.11)
RDW: 15.4 % — ABNORMAL HIGH (ref 11.0–15.0)
RDW: 15.2 % (ref 11.5–15.5)
Total Lymphocyte: 39.5 %
WBC: 9.7 Thousand/uL (ref 3.8–10.8)
WBC: 9.5 K/uL (ref 4.0–10.5)
nRBC: 0 % (ref 0.0–0.2)

## 2024-05-31 LAB — LACTIC ACID, PLASMA
Lactic Acid, Venous: 2.1 mmol/L (ref 0.5–1.9)
Lactic Acid, Venous: 1.3 mmol/L (ref 0.5–1.9)

## 2024-05-31 LAB — COMPREHENSIVE METABOLIC PANEL WITH GFR
ALT: 15 U/L (ref 0–44)
AST: 13 U/L — ABNORMAL LOW (ref 15–41)
Albumin: 3.3 g/dL — ABNORMAL LOW (ref 3.5–5.0)
Alkaline Phosphatase: 93 U/L (ref 38–126)
Anion gap: 9 (ref 5–15)
BUN: 25 mg/dL — ABNORMAL HIGH (ref 6–20)
CO2: 24 mmol/L (ref 22–32)
Calcium: 8.7 mg/dL — ABNORMAL LOW (ref 8.9–10.3)
Chloride: 102 mmol/L (ref 98–111)
Creatinine, Ser: 1.26 mg/dL — ABNORMAL HIGH (ref 0.44–1.00)
GFR, Estimated: 51 mL/min — ABNORMAL LOW (ref >60)
Glucose, Bld: 201 mg/dL — ABNORMAL HIGH (ref 70–99)
Potassium: 4.6 mmol/L (ref 3.5–5.1)
Sodium: 135 mmol/L (ref 135–145)
Total Bilirubin: 0.4 mg/dL (ref 0.0–1.2)
Total Protein: 7.4 g/dL (ref 6.5–8.1)

## 2024-05-31 LAB — C-REACTIVE PROTEIN: CRP: 151 mg/L — ABNORMAL HIGH (ref <8.0)

## 2024-05-31 LAB — GLUCOSE, CAPILLARY: Glucose-Capillary: 227 mg/dL — ABNORMAL HIGH (ref 70–99)

## 2024-05-31 LAB — SEDIMENTATION RATE
Sed Rate: 75 mm/h — ABNORMAL HIGH (ref 0–30)
Sed Rate: 76 mm/h — ABNORMAL HIGH (ref 0–30)

## 2024-05-31 MED ORDER — ENOXAPARIN SODIUM 60 MG/0.6ML IJ SOSY
0.5000 mg/kg | PREFILLED_SYRINGE | INTRAMUSCULAR | Status: DC
  Administered 2024-05-31 – 2024-06-02 (×3): 60 mg via SUBCUTANEOUS
  Filled 2024-05-31 (×3): qty 0.6

## 2024-05-31 MED ORDER — ONDANSETRON HCL 4 MG/2ML IJ SOLN: 4.0000 mg | Freq: Four times a day (QID) | INTRAMUSCULAR | Status: DC | PRN

## 2024-05-31 MED ORDER — INSULIN ASPART 100 UNIT/ML IJ SOLN
0.0000 [IU] | Freq: Three times a day (TID) | INTRAMUSCULAR | Status: DC
  Administered 2024-06-01 – 2024-06-02 (×5): 3 [IU] via SUBCUTANEOUS
  Administered 2024-06-02: 5 [IU] via SUBCUTANEOUS
  Administered 2024-06-03: 3 [IU] via SUBCUTANEOUS
  Administered 2024-06-03: 2 [IU] via SUBCUTANEOUS
  Filled 2024-05-31 (×8): qty 1

## 2024-05-31 MED ORDER — METRONIDAZOLE 500 MG/100ML IV SOLN
500.0000 mg | Freq: Two times a day (BID) | INTRAVENOUS | Status: AC
  Administered 2024-05-31 – 2024-06-03 (×6): 500 mg via INTRAVENOUS
  Filled 2024-05-31 (×8): qty 100

## 2024-05-31 MED ORDER — VANCOMYCIN HCL IN DEXTROSE 1-5 GM/200ML-% IV SOLN
1000.0000 mg | Freq: Once | INTRAVENOUS | Status: AC
  Administered 2024-05-31: 1000 mg via INTRAVENOUS
  Filled 2024-05-31: qty 200

## 2024-05-31 MED ORDER — HYDROMORPHONE HCL 1 MG/ML IJ SOLN: 0.5000 mg | Freq: Four times a day (QID) | INTRAMUSCULAR | Status: DC | PRN

## 2024-05-31 MED ORDER — SODIUM CHLORIDE 0.9 % IV BOLUS
1000.0000 mL | Freq: Once | INTRAVENOUS | Status: AC
  Administered 2024-05-31: 1000 mL via INTRAVENOUS

## 2024-05-31 MED ORDER — INSULIN ASPART 100 UNIT/ML IJ SOLN
0.0000 [IU] | Freq: Every day | INTRAMUSCULAR | Status: DC
  Administered 2024-05-31: 2 [IU] via SUBCUTANEOUS
  Filled 2024-05-31: qty 1

## 2024-05-31 MED ORDER — ONDANSETRON HCL 4 MG PO TABS: 4.0000 mg | ORAL_TABLET | Freq: Four times a day (QID) | ORAL | Status: DC | PRN

## 2024-05-31 MED ORDER — INSULIN GLARGINE-YFGN 100 UNIT/ML ~~LOC~~ SOLN
40.0000 [IU] | Freq: Every day | SUBCUTANEOUS | Status: DC
  Administered 2024-06-01 – 2024-06-02 (×2): 40 [IU] via SUBCUTANEOUS
  Filled 2024-05-31 (×2): qty 0.4

## 2024-05-31 MED ORDER — ACETAMINOPHEN 325 MG PO TABS
650.0000 mg | ORAL_TABLET | Freq: Four times a day (QID) | ORAL | Status: DC | PRN
  Administered 2024-06-01: 650 mg via ORAL
  Filled 2024-05-31 (×2): qty 2

## 2024-05-31 MED ORDER — SODIUM CHLORIDE 0.9 % IV SOLN: INTRAVENOUS | Status: AC

## 2024-05-31 MED ORDER — ACETAMINOPHEN 650 MG RE SUPP: 650.0000 mg | Freq: Four times a day (QID) | RECTAL | Status: DC | PRN

## 2024-05-31 MED ORDER — VANCOMYCIN HCL 1250 MG/250ML IV SOLN: 1250.0000 mg | INTRAVENOUS | Status: DC

## 2024-05-31 MED ORDER — SODIUM CHLORIDE 0.9 % IV SOLN
2.0000 g | INTRAVENOUS | Status: DC
  Administered 2024-05-31 – 2024-06-03 (×4): 2 g via INTRAVENOUS
  Filled 2024-05-31 (×4): qty 20

## 2024-05-31 MED ORDER — VANCOMYCIN HCL 1500 MG/300ML IV SOLN
1500.0000 mg | Freq: Once | INTRAVENOUS | Status: AC
  Administered 2024-06-01: 1500 mg via INTRAVENOUS
  Filled 2024-05-31: qty 300

## 2024-05-31 MED ORDER — SENNOSIDES-DOCUSATE SODIUM 8.6-50 MG PO TABS: 1.0000 | ORAL_TABLET | Freq: Every evening | ORAL | Status: DC | PRN

## 2024-05-31 MED ORDER — SODIUM CHLORIDE 0.9 % IV SOLN: INTRAVENOUS | Status: DC

## 2024-05-31 NOTE — H&P (Addendum)
 History and Physical  Destiny Luna FMW:[id number] DOB: November 07, 1971 DOA: 05/31/2024  PCP: Gareth Mliss FALCON, FNP   Chief Complaint: Left great toe ulcer  HPI: Destiny Luna is a 53 y.o. female with medical history significant for type 2 diabetes, osteomyelitis, hyperlipidemia, morbid obesity and left foot ulcer presented to the ED for evaluation of worsening left foot ulcer. 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 After wearing the shoe for a few days, she noticed a small bruise on the side of her left big toe that started peeling. She clipped off the peel but a few days later she started noticing swelling around her left big toe and of her foot.  She was evaluated by her podiatrist on June 27, she had an x-ray of the foot, started on Bactrim . She noticed some improvement in her foot so she went back to work on Monday. A few days later, she started noticing some tightness and pain in her foot so she followed up with her PCP yesterday. She was started on Levaquin  and referred to outpatient ID. Today, she noticed worsening of her foot pain with swelling that was progressing up her foot and ankles so she presented to the ED for further evaluation. She denies any fevers, chills, nausea, abdominal pain, chest pain, dizziness, headache, trauma or falls. States she has a history of osteomyelitis in multiple phalanges of both feet and they have all been managed with antibiotics by her infectious disease doctor in [place]. She has not had any infections for the last 5 years.  ED Course: Initial vitals show temp 98.4, RR 18, HR 112, BP 141/97, SpO2 99% room air. Initial labs significant for blood glucose 201, creatinine 1.26, WBC 9.5, Hgb 11.7, lactic acid 2.1, ESR 76. X-ray of the left foot shows osteomyelitis of the distal aspect of the left first distal phalanx and chronic erosive changes involving the distal 2nd and 3rd phalanges.  US  DVT  study of the LE negative for DVT.  Pt received IV NS 1 L bolus and IV vancomycin . [facility] was consulted for admission.   Review of Systems: Please see HPI for pertinent positives and negatives. A complete 10 system review of systems are otherwise negative.  Past Medical History:  Diagnosis Date   Diabetes mellitus without complication (HCC)    Past Surgical History:  Procedure Laterality Date   CESAREAN SECTION     HERNIA REPAIR     Social History:  reports that she has quit smoking. Her smoking use included cigarettes. She has a 1 pack-year smoking history. She has never used smokeless tobacco. She reports that she does not currently use alcohol. She reports that she does not use drugs.  No Known Allergies  Family History  Problem Relation Age of Onset   Diabetes Mother    CAD Father        CABG x 3   Stroke Father    Diabetes Father      Prior to Admission medications   Medication Sig Start Date End Date Taking? Authorizing Provider  gentamicin  cream (GARAMYCIN ) 0.1 % Apply 1 Application topically 2 (two) times daily. 05/24/24   Janit Thresa HERO, DPM  insulin  degludec (TRESIBA  FLEXTOUCH) 100 UNIT/ML FlexTouch Pen Inject 60 Units into the skin daily. 01/26/24   Gareth Mliss FALCON, FNP  insulin  degludec (TRESIBA  FLEXTOUCH) 100 UNIT/ML FlexTouch Pen Inject 60 Units into the skin daily. 01/26/24   Gareth Mliss FALCON,  FNP  Insulin  Pen Needle 32G X 6 MM MISC 1 each by Does not apply route daily. 01/26/24   Gareth Mliss FALCON, FNP  levofloxacin  (LEVAQUIN ) 750 MG tablet Take 1 tablet (750 mg total) by mouth daily for 10 days. 05/30/24 06/09/24  Sikora, Rebecca, DPM  methocarbamol  (ROBAXIN ) 500 MG tablet Take 1 tablet (500 mg total) by mouth 2 (two) times daily. 03/27/24   Brimage, Vondra, DO  naproxen  (NAPROSYN ) 500 MG tablet Take 1 tablet (500 mg total) by mouth 2 (two) times daily with a meal. 03/27/24   Brimage, Vondra, DO  sulfamethoxazole -trimethoprim  (BACTRIM  DS) 800-160 MG tablet Take 1 tablet by mouth  2 (two) times daily. 05/24/24   Janit Thresa HERO, DPM  tirzepatide  (MOUNJARO ) 10 MG/0.5ML Pen Inject 10 mg into the skin once a week. 05/14/24   Bernardo Fend, DO  fluticasone  (FLONASE ) 50 MCG/ACT nasal spray Place 2 sprays into both nostrils daily. 03/27/20 09/26/20  Van Knee, MD  levocetirizine (XYZAL ) 5 MG tablet Take 1 tablet (5 mg total) by mouth every evening. 03/27/20 09/26/20  Van Knee, MD    Physical Exam: BP (!) 111/98   Pulse (!) 104   Temp 98.4 F (36.9 C) (Oral)   Resp 16   Ht 5' 5 (1.651 m)   Wt 117.9 kg   SpO2 100%   BMI 43.27 kg/m  General: Pleasant, well-appearing middle-aged woman laying in bed. No acute distress. HEENT: Lamb/AT. Anicteric sclera CV: RRR. No murmurs, rubs, or gallops. Mild left foot edema Pulmonary: Lungs CTAB. Normal effort. No wheezing or rales. Abdominal: Soft, nontender, nondistended. Normal bowel sounds. Extremities: Left big toe ulcer distally with mild purulent drainage and tenderness. Palpable pedal pulses. Skin: Warm and dry. Left big toe ulcer (images below) Neuro: A&Ox3. Moves all extremities. Normal sensation to light touch. No focal deficit. Psych: Normal mood and affect            Labs on Admission:  Basic Metabolic Panel: Recent Labs  Lab 05/31/24 1321  NA 135  K 4.6  CL 102  CO2 24  GLUCOSE 201*  BUN 25*  CREATININE 1.26*  CALCIUM 8.7*   Liver Function Tests: Recent Labs  Lab 05/31/24 1321  AST 13*  ALT 15  ALKPHOS 93  BILITOT 0.4  PROT 7.4  ALBUMIN 3.3*   No results for input(s): LIPASE, AMYLASE in the last 168 hours. No results for input(s): AMMONIA in the last 168 hours. CBC: Recent Labs  Lab 05/30/24 1530 05/31/24 1321  WBC 9.7 9.5  NEUTROABS 4,695 5.0  HGB 12.4 11.7*  HCT 38.8 37.6  MCV 81.7 82.5  PLT 331 304   Cardiac Enzymes: No results for input(s): CKTOTAL, CKMB, CKMBINDEX, TROPONINI in the last 168 hours. BNP (last 3 results) No results for input(s): BNP  in the last 8760 hours.  ProBNP (last 3 results) No results for input(s): PROBNP in the last 8760 hours.  CBG: No results for input(s): GLUCAP in the last 168 hours.  Radiological Exams on Admission: US  Venous Img Lower Unilateral Left Result Date: 05/31/2024 CLINICAL DATA:  Foot infection with increased pain EXAM: Left LOWER EXTREMITY VENOUS DOPPLER ULTRASOUND TECHNIQUE: Gray-scale sonography with compression, as well as color and duplex ultrasound, were performed to evaluate the deep venous system(s) from the level of the common femoral vein through the popliteal and proximal calf veins. COMPARISON:  None Available. FINDINGS: VENOUS Normal compressibility of the common femoral, superficial femoral, and popliteal veins, as well as the visualized calf veins. Visualized  portions of profunda femoral vein and great saphenous vein unremarkable. No filling defects to suggest DVT on grayscale or color Doppler imaging. Doppler waveforms show normal direction of venous flow, normal respiratory plasticity and response to augmentation. Limited views of the contralateral common femoral vein are unremarkable. OTHER Enlarged left groin lymph node measuring 2.8 x 4.9 x 1.2 cm Limitations: none IMPRESSION: 1. Negative for acute left lower extremity DVT. 2. Enlarged left groin lymph node Electronically Signed   By: Luke Bun M.D.   On: 05/31/2024 18:58   DG Foot 2 Views Left Result Date: 05/31/2024 CLINICAL DATA:  Diabetic with great toe infection. Evaluate for osteomyelitis. EXAM: LEFT FOOT - 2 VIEW COMPARISON:  Radiograph 05/24/2024 and 09/04/2023. FINDINGS: New extensive osteolysis and probable pathologic fracture involving the distal aspect of the left 1st distal phalanx. Stable chronic erosive changes involving the distal 2nd and 3rd phalanges and hammertoe deformities. Stable chronic degenerative changes in the midfoot and at the 1st metatarsophalangeal joint. There are prominent bidirectional calcaneal  spurs and mild forefoot soft tissue swelling. No evidence of foreign body or soft tissue emphysema. IMPRESSION: 1. New extensive osteolysis and probable pathologic fracture involving the distal aspect of the left 1st distal phalanx, consistent with osteomyelitis. 2. Stable chronic erosive changes involving the distal 2nd and 3rd phalanges. 3. Stable chronic degenerative changes in the midfoot and 1st metatarsophalangeal joint. Electronically Signed   By: Elsie Perone M.D.   On: 05/31/2024 18:06   Assessment/Plan Miyo Aina is a 53 y.o. female with medical history significant for type 2 diabetes, hyperlipidemia, morbid obesity and left foot ulcer presented to the ED for evaluation of worsening left foot ulcer and admitted for osteomyelitis of the left great toe.   # Left great toe osteomyelitis # Diabetic foot ulcer - Patient with diabetes who reports a history of multiple episodes of osteomyelitis of bilateral phalanges - She presents with worsening left first toe ulcer over the last 3 weeks - She is hemodynamically stable with no evidence of systemic infection - X-ray of the left foot shows osteomyelitis of the distal aspect of the left first distal phalanx - There is an increased risk of polymicrobial infection in the setting of her diabetes, so will broaden antibiotics - Continue IV vancomycin , start IV Rocephin  and Flagyl  - Podiatry consulted, appreciated recs - Pain control with PRN IV Dilaudid  - Follow-up blood culture - Trend CBC, fever curve  # T2DM - Last A1c 7.4% 2 months ago - Blood glucose of 201 on admission - Home regimen includes Tresiba  60 units daily and mounjaro  10 mg weekly - Patient reports the dose of her Tresiba  is based on her morning blood sugar and has consistently taken less than the 60 units and occasionally no insulin  if her morning fasting is below 100. She gave herself Tresiba  30 units this morning. - Start Semglee  40 units daily for now - SSI with  meals, CBG monitoring - Carb modified diet - F/u repeat A1c  # AKI - Creatinine slightly elevated to 1.26, from normal baseline - Likely secondary to mild dehydration in the setting of acute infection - S/p 1 L IVNS in the ED, start IVNS at 100 cc/hr for 10 hrs - Trend renal function - Avoid nephrotoxic meds  # HLD - Lipid panel 2 months ago showed LDL 125 - Patient reports she did not tolerate statin in the past and has stopped taking it - Recommend discussing alternative treatment with PCP  # Class III obesity Body  mass index is 43.27 kg/m. Filed Weights   05/31/24 1312  Weight: 117.9 kg  - Continue weekly mounjaro  at discharge   DVT prophylaxis: Lovenox      Code Status: Full Code  Consults called: Podiatry  Family Communication: No family at bedside  Severity of Illness: The appropriate patient status for this patient is INPATIENT. Inpatient status is judged to be reasonable and necessary in order to provide the required intensity of service to ensure the patient's safety. The patient's presenting symptoms, physical exam findings, and initial radiographic and laboratory data in the context of their chronic comorbidities is felt to place them at high risk for further clinical deterioration. Furthermore, it is not anticipated that the patient will be medically stable for discharge from the hospital within 2 midnights of admission.   * I certify that at the point of admission it is my clinical judgment that the patient will require inpatient hospital care spanning beyond 2 midnights from the point of admission due to high intensity of service, high risk for further deterioration and high frequency of surveillance required.*  Level of care: Med-Surg   This record has been created using Conservation officer, historic buildings. Errors have been sought and corrected, but may not always be located. Such creation errors do not reflect on the standard of care.   Lou Claretta HERO,  MD 05/31/2024, 7:48 PM [facility] Pager: [phone] Isaiah 41:10   If 7PM-7AM, please contact night-coverage [email or web address] Password [email or web address]

## 2024-05-31 NOTE — ED Provider Notes (Signed)
 [facility] Provider Note    Event Date/Time   First MD Initiated Contact with Patient 05/31/24 1603     (approximate)   History   Toe Pain and Leg Pain   HPI  Destiny Luna is a 53 y.o. female with history of diabetes, obesity, and hyperlipidemia who presents with left foot and leg pain.  The patient has had a ulcer on the fifth toe as well as an infection of the great toe, initially on Bactrim , and started on Levaquin  yesterday.  She has taken 1 dose.  However today she noticed new, worsening pain in the foot as well as going up the leg as high as her mid thigh.  She has not experienced this before.  She denies any fever or chills.  She denies any significant swelling to the leg.  She has not had any trauma.  I reviewed the past medical records.  The patient was seen by primary care yesterday for this left great toe pain.  She was previously seen by podiatry on 6/27 for an ulcer on the fifth toe and prescribed Bactrim . B She had worsening swelling and pain.  She was started on Levaquin , but reported continued pain at that time.   Physical Exam   Triage Vital Signs: ED Triage Vitals  Encounter Vitals Group     BP 05/31/24 1311 (!) 141/97     Girls Systolic BP Percentile --      Girls Diastolic BP Percentile --      Boys Systolic BP Percentile --      Boys Diastolic BP Percentile --      Pulse Rate 05/31/24 1311 (!) 112     Resp 05/31/24 1311 18     Temp 05/31/24 1311 98.4 F (36.9 C)     Temp Source 05/31/24 1311 Oral     SpO2 05/31/24 1311 99 %     Weight 05/31/24 1312 260 lb (117.9 kg)     Height 05/31/24 1312 5' 5 (1.651 m)     Head Circumference --      Peak Flow --      Pain Score 05/31/24 1312 5     Pain Loc --      Pain Education --      Exclude from Growth Chart --     Most recent vital signs: Vitals:   05/31/24 1853 05/31/24 2043  BP:  136/86  Pulse:  (!) 102  Resp:  18  Temp: 98.4 F (36.9 C) 98.1 F (36.7 C)  SpO2:  97%      General: Awake, no distress.  CV:  Good peripheral perfusion.  Resp:  Normal effort.  Abd:  No distention.  Other:  Left great toe currently dressed.  Healing ulcer to the left fifth toe.  No significant erythema or induration to the foot or leg, however the dorsum of the foot and anterior lower leg are warm to the touch compared to right.  Full range of motion of the knee.  No calf or popliteal swelling or tenderness.   ED Results / Procedures / Treatments   Labs (all labs ordered are listed, but only abnormal results are displayed) Labs Reviewed  CBC WITH DIFFERENTIAL/PLATELET - Abnormal; Notable for the following components:      Result Value   Hemoglobin 11.7 (*)    MCH 25.7 (*)    All other components within normal limits  COMPREHENSIVE METABOLIC PANEL WITH GFR - Abnormal; Notable for the following components:  Glucose, Bld 201 (*)    BUN 25 (*)    Creatinine, Ser 1.26 (*)    Calcium 8.7 (*)    Albumin 3.3 (*)    AST 13 (*)    GFR, Estimated 51 (*)    All other components within normal limits  SEDIMENTATION RATE - Abnormal; Notable for the following components:   Sed Rate 76 (*)    All other components within normal limits  LACTIC ACID, PLASMA - Abnormal; Notable for the following components:   Lactic Acid, Venous 2.1 (*)    All other components within normal limits  GLUCOSE, CAPILLARY - Abnormal; Notable for the following components:   Glucose-Capillary 227 (*)    All other components within normal limits  CULTURE, BLOOD (ROUTINE X 2)  CULTURE, BLOOD (ROUTINE X 2)  LACTIC ACID, PLASMA  HIV ANTIBODY (ROUTINE TESTING W REFLEX)  HEMOGLOBIN A1C  CBC  C-REACTIVE PROTEIN  BASIC METABOLIC PANEL WITH GFR     EKG     RADIOLOGY  XR L foot: I independently viewed and interpreted the images; there is a fracture of the distal phalanx of the first toe.  Radiology report indicates the following:  IMPRESSION:  1. New extensive osteolysis and probable pathologic  fracture  involving the distal aspect of the left 1st distal phalanx,  consistent with osteomyelitis.  2. Stable chronic erosive changes involving the distal 2nd and 3rd  phalanges.  3. Stable chronic degenerative changes in the midfoot and 1st  metatarsophalangeal joint.    US  venous LLE: No acute DVT   PROCEDURES:  Critical Care performed: No  Procedures   MEDICATIONS ORDERED IN ED: Medications  enoxaparin  (LOVENOX ) injection 60 mg (60 mg Subcutaneous Given 05/31/24 2215)  acetaminophen  (TYLENOL ) tablet 650 mg (has no administration in time range)    Or  acetaminophen  (TYLENOL ) suppository 650 mg (has no administration in time range)  senna-docusate (Senokot-S) tablet 1 tablet (has no administration in time range)  ondansetron  (ZOFRAN ) tablet 4 mg (has no administration in time range)    Or  ondansetron  (ZOFRAN ) injection 4 mg (has no administration in time range)  HYDROmorphone  (DILAUDID ) injection 0.5 mg (has no administration in time range)  insulin  aspart (novoLOG ) injection 0-15 Units (has no administration in time range)  insulin  aspart (novoLOG ) injection 0-5 Units (2 Units Subcutaneous Given 05/31/24 2216)  metroNIDAZOLE  (FLAGYL ) IVPB 500 mg (500 mg Intravenous New Bag/Given 05/31/24 2316)  cefTRIAXone  (ROCEPHIN ) 2 g in sodium chloride  0.9 % 100 mL IVPB (0 g Intravenous Stopped 05/31/24 2254)  insulin  glargine-yfgn (SEMGLEE ) injection 40 Units (has no administration in time range)  vancomycin  (VANCOREADY) IVPB 1500 mg/300 mL (has no administration in time range)  0.9 %  sodium chloride  infusion ( Intravenous Infusion Verify 05/31/24 2259)  vancomycin  (VANCOREADY) IVPB 1250 mg/250 mL (has no administration in time range)  sodium chloride  0.9 % bolus 1,000 mL (0 mLs Intravenous Stopped 05/31/24 2039)  vancomycin  (VANCOCIN ) IVPB 1000 mg/200 mL premix (0 mg Intravenous Stopped 05/31/24 2039)     IMPRESSION / MDM / ASSESSMENT AND PLAN / ED COURSE  I reviewed the triage vital signs  and the nursing notes.  53 year old female with PMH as noted above currently being treated for left foot infection, just started on Levaquin , presents with worsening pain to the left leg as well as some warmth to the skin and possible swelling.  On exam the vital signs are normal except for tachycardia.  There is some warmth to the skin over the foot  and distal lower leg but no significant erythema.  Differential diagnosis includes, but is not limited to, cellulitis with lymphatic spread, osteomyelitis, DVT.  We will obtain lab workup, x-ray, ultrasound, and reassess.  Patient's presentation is most consistent with acute presentation with potential threat to life or bodily function.  The patient is on the cardiac monitor to evaluate for evidence of arrhythmia and/or significant heart rate changes.   ----------------------------------------- 7:44 PM on 05/31/2024 -----------------------------------------  Labs are unremarkable.  CBC shows no leukocytosis.  Lactic is borderline elevated.  ESR is elevated but stable from prior.  Ultrasound is negative.  X-ray, however, shows a likely pathologic fracture and osteolysis of the distal phalanx of the great toe, concerning for acute osteomyelitis.  I have ordered IV vancomycin .  The patient will need inpatient admission for further management.  I consulted Dr. Lou from the hospitalist service; based on our discussion he agrees to evaluate the patient for admission.   FINAL CLINICAL IMPRESSION(S) / ED DIAGNOSES   Final diagnoses:  Osteomyelitis of left foot, unspecified type (HCC)     Rx / DC Orders   ED Discharge Orders     None        Note:  This document was prepared using Dragon voice recognition software and may include unintentional dictation errors.    Jacolyn Pae, MD 05/31/24 2329

## 2024-05-31 NOTE — Progress Notes (Signed)
 PHARMACIST - PHYSICIAN COMMUNICATION  CONCERNING:  Enoxaparin  (Lovenox ) for DVT Prophylaxis    RECOMMENDATION: Patient was prescribed enoxaprin 40mg  q24 hours for VTE prophylaxis.   Filed Weights   05/31/24 1312  Weight: 117.9 kg (260 lb)    Body mass index is 43.27 kg/m.  Estimated Creatinine Clearance: 67.1 mL/min (A) (by C-G formula based on SCr of 1.26 mg/dL (H)).   Based on [facility] policy patient is candidate for enoxaparin  0.5mg /kg TBW SQ every 24 hours based on BMI being >30.  DESCRIPTION: Pharmacy has adjusted enoxaparin  dose per [facility] policy.  Patient is now receiving enoxaparin  60 mg every 24 hours    Damien Napoleon, PharmD Clinical Pharmacist  05/31/2024 8:26 PM

## 2024-05-31 NOTE — ED Triage Notes (Signed)
 Patient presents with left foot, great toe wound present since June 20th. Patient reports now pain is spreading up left leg. Pus noted around wound.   No foul smell   Reports history osteomylitis X5  History diabetes

## 2024-05-31 NOTE — Progress Notes (Signed)
 Pharmacy Antibiotic Note  Destiny Luna is a 53 y.o. female admitted on 05/31/2024 with osteomyelitis.  Pharmacy has been consulted for Vancomycin  dosing.  Plan: Vancomycin  1 gm IV X 1 given in ED on 7/4 @ 1940.  Additional Vanc 1500 mg IV X 1 ordered to make total loading dose of 2500 mg.  Vancomycin  1250 mg IV Q24H ordered to start on 7/5 @ 1930.  AUC = 488.5 Vanc trough = 12.3   Height: 5' 5 (165.1 cm) Weight: 117.9 kg (260 lb) IBW/kg (Calculated) : 57  Temp (24hrs), Avg:98.3 F (36.8 C), Min:98.1 F (36.7 C), Max:98.4 F (36.9 C)  Recent Labs  Lab 05/30/24 1530 05/31/24 1321 05/31/24 1641 05/31/24 2054  WBC 9.7 9.5  --   --   CREATININE  --  1.26*  --   --   LATICACIDVEN  --   --  2.1* 1.3    Estimated Creatinine Clearance: 67.1 mL/min (A) (by C-G formula based on SCr of 1.26 mg/dL (H)).    No Known Allergies  Antimicrobials this admission:   >>    >>   Dose adjustments this admission:   Microbiology results:  BCx:   UCx:    Sputum:    MRSA PCR:   Thank you for allowing pharmacy to be a part of this patient's care.  Clare Fennimore D 05/31/2024 11:16 PM

## 2024-05-31 NOTE — ED Notes (Addendum)
 Pt being transported to rm 260 at this time via [facility]. 1A called and informed that pt is on the way.

## 2024-06-01 DIAGNOSIS — M869 Osteomyelitis, unspecified: Secondary | ICD-10-CM | POA: Diagnosis not present

## 2024-06-01 LAB — BASIC METABOLIC PANEL WITH GFR
Anion gap: 9 (ref 5–15)
BUN: 24 mg/dL — ABNORMAL HIGH (ref 6–20)
CO2: 23 mmol/L (ref 22–32)
Calcium: 8.3 mg/dL — ABNORMAL LOW (ref 8.9–10.3)
Chloride: 104 mmol/L (ref 98–111)
Creatinine, Ser: 1.11 mg/dL — ABNORMAL HIGH (ref 0.44–1.00)
GFR, Estimated: 60 mL/min — ABNORMAL LOW (ref >60)
Glucose, Bld: 190 mg/dL — ABNORMAL HIGH (ref 70–99)
Potassium: 4.1 mmol/L (ref 3.5–5.1)
Sodium: 136 mmol/L (ref 135–145)

## 2024-06-01 LAB — GLUCOSE, CAPILLARY
Glucose-Capillary: 160 mg/dL — ABNORMAL HIGH (ref 70–99)
Glucose-Capillary: 169 mg/dL — ABNORMAL HIGH (ref 70–99)
Glucose-Capillary: 165 mg/dL — ABNORMAL HIGH (ref 70–99)
Glucose-Capillary: 183 mg/dL — ABNORMAL HIGH (ref 70–99)

## 2024-06-01 LAB — CBC
HCT: 34.6 % — ABNORMAL LOW (ref 36.0–46.0)
Hemoglobin: 11 g/dL — ABNORMAL LOW (ref 12.0–15.0)
MCH: 25.9 pg — ABNORMAL LOW (ref 26.0–34.0)
MCHC: 31.8 g/dL (ref 30.0–36.0)
MCV: 81.4 fL (ref 80.0–100.0)
Platelets: 278 K/uL (ref 150–400)
RBC: 4.25 MIL/uL (ref 3.87–5.11)
RDW: 15.2 % (ref 11.5–15.5)
WBC: 7.3 K/uL (ref 4.0–10.5)
nRBC: 0 % (ref 0.0–0.2)

## 2024-06-01 LAB — HIV ANTIBODY (ROUTINE TESTING W REFLEX): HIV Screen 4th Generation wRfx: NONREACTIVE

## 2024-06-01 LAB — C-REACTIVE PROTEIN: CRP: 12.1 mg/dL — ABNORMAL HIGH (ref <1.0)

## 2024-06-01 LAB — HEMOGLOBIN A1C
Hgb A1c MFr Bld: 7.5 % — ABNORMAL HIGH (ref 4.8–5.6)
Mean Plasma Glucose: 168.55 mg/dL

## 2024-06-01 MED ORDER — OXYCODONE HCL 5 MG PO TABS: 5.0000 mg | ORAL_TABLET | Freq: Four times a day (QID) | ORAL | Status: DC | PRN

## 2024-06-01 MED ORDER — VANCOMYCIN HCL 1500 MG/300ML IV SOLN
1500.0000 mg | INTRAVENOUS | Status: DC
  Administered 2024-06-01: 1500 mg via INTRAVENOUS
  Filled 2024-06-01 (×2): qty 300

## 2024-06-01 MED ORDER — KETOROLAC TROMETHAMINE 0.5 % OP SOLN
1.0000 [drp] | Freq: Four times a day (QID) | OPHTHALMIC | Status: DC
  Administered 2024-06-01 – 2024-06-03 (×7): 1 [drp] via OPHTHALMIC
  Filled 2024-06-01: qty 3

## 2024-06-01 MED ORDER — PREDNISOLONE ACETATE 1 % OP SUSP
1.0000 [drp] | Freq: Four times a day (QID) | OPHTHALMIC | Status: DC
  Administered 2024-06-01 – 2024-06-03 (×8): 1 [drp] via OPHTHALMIC
  Filled 2024-06-01: qty 1

## 2024-06-01 NOTE — Progress Notes (Signed)
 Approximately 2050--Pt arrived to room 160A from ED. Upon arrival, VS obtained and assessment completed by this RN. Pt oriented to room and unit. Fall precautions in place.

## 2024-06-01 NOTE — Progress Notes (Signed)
 PROGRESS NOTE    Destiny Luna  FMW:[id number] DOB: 01/23/1971 DOA: 05/31/2024 PCP: Gareth Mliss FALCON, FNP     Brief Narrative:   From admission h and p  Destiny Luna is a 53 y.o. female with medical history significant for type 2 diabetes, osteomyelitis, hyperlipidemia, morbid obesity and left foot ulcer presented to the ED for evaluation of worsening left foot ulcer. 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 After wearing the shoe for a few days, she noticed a small bruise on the side of her left big toe that started peeling. She clipped off the peel but a few days later she started noticing swelling around her left big toe and of her foot.  She was evaluated by her podiatrist on June 27, she had an x-ray of the foot, started on Bactrim . She noticed some improvement in her foot so she went back to work on Monday. A few days later, she started noticing some tightness and pain in her foot so she followed up with her PCP yesterday. She was started on Levaquin  and referred to outpatient ID. Today, she noticed worsening of her foot pain with swelling that was progressing up her foot and ankles so she presented to the ED for further evaluation. She denies any fevers, chills, nausea, abdominal pain, chest pain, dizziness, headache, trauma or falls. States she has a history of osteomyelitis in multiple phalanges of both feet and they have all been managed with antibiotics by her infectious disease doctor in [place]. She has not had any infections for the last 5 years.   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 Here failed outpatient bactrim  and was started on levofloxacin  2 days ago. Here x-ray shows extensive osteolysis and probable pathologic fracture involving the distal aspect of the left 1st distal phalanx, consistent w/ osteomyelitis. Clinically signs of infection with tenderness and swelling tracking up medial thigh with  left groin lymphadenopathy on u/s (which was negative for DVT). Strong palpable pulses. Pain improving with antibiotics - podiatry to see - continue vancomycin , ceftriaxone , metronidazole   # T2DM History poor control, here A1c 7.5. On degludec 60 units daily, mounjaro  - SSI - semglee  40  # Recent cataract surgery - resume home pred forte   # Obesity noted   DVT prophylaxis: lovenox  Code Status: full Family Communication: none at bedside  Level of care: Med-Surg Status is: Inpatient Remains inpatient appropriate because: severity of illness    Consultants:  podiatry  Procedures: None thus far  Antimicrobials:  See above    Subjective: Reports improving pain in foot and leg  Objective: Vitals:   05/31/24 1853 05/31/24 2043 06/01/24 0424 06/01/24 0822  BP:  136/86 132/73 122/75  Pulse:  (!) 102 (!) 103 (!) 106  Resp:  18 16 16   Temp: 98.4 F (36.9 C) 98.1 F (36.7 C) 97.7 F (36.5 C) 98.9 F (37.2 C)  TempSrc: Oral Oral Oral Oral  SpO2:  97% 98% 100%  Weight:  Height:        Intake/Output Summary (Last 24 hours) at 06/01/2024 1215 Last data filed at 06/01/2024 0705 Gross per 24 hour  Intake 2575.28 ml  Output --  Net 2575.28 ml   Filed Weights   05/31/24 1312  Weight: 117.9 kg    Examination:  General exam: Appears calm and comfortable  Respiratory system: Clear to auscultation. Respiratory effort normal. Cardiovascular system: S1 & S2 heard, RRR. No JVD, murmurs, rubs, gallops or clicks. No pedal  edema. Gastrointestinal system: Abdomen is nondistended, obese, soft.  Central nervous system: Alert and oriented. No focal neurological deficits. Extremities: mild swelling left leg, mild tenderness medially Skin: ulcer over distal surface of left 1st toe Psychiatry: Judgement and insight appear normal. Mood & affect appropriate.     Data Reviewed: I have personally reviewed following labs and imaging studies  CBC: Recent Labs  Lab 05/30/24 1530 05/31/24 1321 06/01/24 0518  WBC 9.7 9.5 7.3  NEUTROABS 4,695 5.0  --   HGB 12.4 11.7* 11.0*  HCT 38.8 37.6 34.6*  MCV 81.7 82.5 81.4  PLT 331 304 278   Basic Metabolic Panel: Recent Labs  Lab 05/31/24 1321 06/01/24 0518  NA 135 136  K 4.6 4.1  CL 102 104  CO2 24 23  GLUCOSE 201* 190*  BUN 25* 24*  CREATININE 1.26* 1.11*  CALCIUM 8.7* 8.3*   GFR: Estimated Creatinine Clearance: 76.2 mL/min (A) (by C-G formula based on SCr of 1.11 mg/dL (H)). Liver Function Tests: Recent Labs  Lab 05/31/24 1321  AST 13*  ALT 15  ALKPHOS 93  BILITOT 0.4  PROT 7.4  ALBUMIN 3.3*   No results for input(s): LIPASE, AMYLASE in the last 168 hours. No results for input(s): AMMONIA in the last 168 hours. Coagulation Profile: No results for input(s): INR, PROTIME in the last 168 hours. Cardiac Enzymes: No results for input(s): CKTOTAL, CKMB, CKMBINDEX, TROPONINI in the last 168 hours. BNP (last 3 results) No results for input(s): PROBNP in the last 8760 hours. HbA1C: Recent Labs    06/01/24 0518  HGBA1C 7.5*   CBG: Recent Labs  Lab 05/31/24 2057 06/01/24 0821 06/01/24 1205  GLUCAP 227* 160* 169*   Lipid Profile: No results for input(s): CHOL, HDL, LDLCALC, TRIG, CHOLHDL, LDLDIRECT in the last 72 hours. Thyroid Function Tests: No results for input(s): TSH, T4TOTAL, FREET4, T3FREE, THYROIDAB in the last 72 hours. Anemia Panel: No results for input(s): VITAMINB12, FOLATE,  FERRITIN, TIBC, IRON, RETICCTPCT in the last 72 hours. Urine analysis:    Component Value Date/Time   LABSPEC >=1.030 06/03/2021 1907   PHURINE 6.5 06/03/2021 1907   GLUCOSEU NEGATIVE 06/03/2021 1907   HGBUR SMALL (A) 06/03/2021 1907   BILIRUBINUR NEGATIVE 06/03/2021 1907   KETONESUR NEGATIVE 06/03/2021 1907   PROTEINUR >=300 (A) 06/03/2021 1907   UROBILINOGEN 0.2 06/03/2021 1907   NITRITE POSITIVE (A) 06/03/2021 1907   LEUKOCYTESUR NEGATIVE 06/03/2021 1907   Sepsis Labs: @LABRCNTIP (procalcitonin:4,lacticidven:4)  ) Recent Results (from the past 240 hours)  WOUND CULTURE     Status: None   Collection Time: 05/24/24 12:23 PM  Result Value Ref Range Status   MICRO NUMBER: [id number]  Final   SPECIMEN QUALITY: Adequate  Final   SOURCE: WOUND  Final   STATUS: FINAL  Final   GRAM STAIN: Gram-positive cocci in chains  Final    Comment: No white blood cells seen No epithelial cells seen Many Gram positive cocci in clusters Many Gram positive cocci in chains Few Gram  negative bacilli   RESULT:   Final    A mix of organisms of questionable significance was recovered on culture and not further identified. (Note: Growth did not detect the presence of S.aureus, beta-hemolytic Streptococci or P.aeruginosa).  Culture, blood (Routine X 2) w Reflex to ID Panel     Status: None (Preliminary result)   Collection Time: 05/31/24 10:13 PM   Specimen: BLOOD  Result Value Ref Range Status   Specimen Description BLOOD BLOOD LEFT HAND  Final   Special Requests   Final    BOTTLES DRAWN AEROBIC AND ANAEROBIC Blood Culture adequate volume   Culture   Final    NO GROWTH < 12 HOURS Performed at [facility], [address] [place], [place] [address]    Report Status PENDING  Incomplete  Culture, blood (Routine X 2) w Reflex to ID Panel     Status: None (Preliminary result)   Collection Time: 05/31/24 10:13 PM   Specimen: BLOOD  Result Value Ref Range Status   Specimen Description  BLOOD BLOOD RIGHT HAND  Final   Special Requests   Final    Blood Culture results may not be optimal due to an inadequate volume of blood received in culture bottles   Culture   Final    NO GROWTH < 12 HOURS Performed at [facility], [address] [place], [place] [address]    Report Status PENDING  Incomplete         Radiology Studies: US  Venous Img Lower Unilateral Left Result Date: 05/31/2024 CLINICAL DATA:  Foot infection with increased pain EXAM: Left LOWER EXTREMITY VENOUS DOPPLER ULTRASOUND TECHNIQUE: Gray-scale sonography with compression, as well as color and duplex ultrasound, were performed to evaluate the deep venous system(s) from the level of the common femoral vein through the popliteal and proximal calf veins. COMPARISON:  None Available. FINDINGS: VENOUS Normal compressibility of the common femoral, superficial femoral, and popliteal veins, as well as the visualized calf veins. Visualized portions of profunda femoral vein and great saphenous vein unremarkable. No filling defects to suggest DVT on grayscale or color Doppler imaging. Doppler waveforms show normal direction of venous flow, normal respiratory plasticity and response to augmentation. Limited views of the contralateral common femoral vein are unremarkable. OTHER Enlarged left groin lymph node measuring 2.8 x 4.9 x 1.2 cm Limitations: none IMPRESSION: 1. Negative for acute left lower extremity DVT. 2. Enlarged left groin lymph node Electronically Signed   By: Luke Bun M.D.   On: 05/31/2024 18:58   DG Foot 2 Views Left Result Date: 05/31/2024 CLINICAL DATA:  Diabetic with great toe infection. Evaluate for osteomyelitis. EXAM: LEFT FOOT - 2 VIEW COMPARISON:  Radiograph 05/24/2024 and 09/04/2023. FINDINGS: New extensive osteolysis and probable pathologic fracture involving the distal aspect of the left 1st distal phalanx. Stable chronic erosive changes involving the distal 2nd and 3rd phalanges and hammertoe  deformities. Stable chronic degenerative changes in the midfoot and at the 1st metatarsophalangeal joint. There are prominent bidirectional calcaneal spurs and mild forefoot soft tissue swelling. No evidence of foreign body or soft tissue emphysema. IMPRESSION: 1. New extensive osteolysis and probable pathologic fracture involving the distal aspect of the left 1st distal phalanx, consistent with osteomyelitis. 2. Stable chronic erosive changes involving the distal 2nd and 3rd phalanges. 3. Stable chronic degenerative changes in the midfoot and 1st metatarsophalangeal joint. Electronically Signed   By: Elsie Perone M.D.   On: 05/31/2024 18:06        Scheduled  Meds:  enoxaparin  (LOVENOX ) injection  0.5 mg/kg Subcutaneous Q24H   insulin  aspart  0-15 Units Subcutaneous TID WC   insulin  aspart  0-5 Units Subcutaneous QHS   insulin  glargine-yfgn  40 Units Subcutaneous Daily   Continuous Infusions:  cefTRIAXone  (ROCEPHIN )  IV Stopped (05/31/24 2254)   metronidazole  500 mg (06/01/24 9161)   vancomycin        LOS: 1 day     Devaughn KATHEE Ban, MD [facility]   If 7PM-7AM, please contact night-coverage [email or web address] Password [email or web address] 06/01/2024, 12:15 PM

## 2024-06-01 NOTE — Plan of Care (Signed)

## 2024-06-01 NOTE — Plan of Care (Signed)

## 2024-06-01 NOTE — Progress Notes (Signed)
 Pharmacy Antibiotic Note  Destiny Luna is a 53 y.o. female admitted on 05/31/2024 with left foot ulcer/ osteomyelitis.  Pharmacy has been consulted for Vancomycin  dosing.  -per H&P: sees an Infectious Disease MD in [place]. hx OM in multiple phalanges of both feet , no infections fo rthe last 5 yrs  -evaluated by her podiatrist on June 27, started on Bactrim . Went back to work 7/30. A few days later, she started noticing some tightness and pain in her foot so she followed up with her PCP 7/3. She was started on Levaquin  and referred to outpt ID. Returned to ED 7/4 -also on Ceftriaxone  and Metronidazole   Plan: Vancomycin  1 gm IV X 1 given in ED on 7/4 @ 1940.  Additional Vanc 1500 mg IV X 1 ordered to make total loading dose of 2500 mg.  - Scr 1.26>>1.11    Will adjust Vancomycin  from 1250 mg to 1500 mg IV Q24H  AUC = 522 Cmin 12.3 Scr 1.11 BMI 43  Vd 0.5  Continue to assess renal fxn, cultures and length of therapy, etc   Height: 5' 5 (165.1 cm) Weight: 117.9 kg (260 lb) IBW/kg (Calculated) : 57  Temp (24hrs), Avg:98.3 F (36.8 C), Min:97.7 F (36.5 C), Max:98.9 F (37.2 C)  Recent Labs  Lab 05/30/24 1530 05/31/24 1321 05/31/24 1641 05/31/24 2054 06/01/24 0518  WBC 9.7 9.5  --   --  7.3  CREATININE  --  1.26*  --   --  1.11*  LATICACIDVEN  --   --  2.1* 1.3  --     Estimated Creatinine Clearance: 76.2 mL/min (A) (by C-G formula based on SCr of 1.11 mg/dL (H)).    No Known Allergies  Antimicrobials this admission: Vancomycin  7/4  (evening)>>  Ceftriaxone  7/4  (evening)>>  Metronidazole  7/4  (evening)>>  Dose adjustments this admission:   Microbiology results: 7/4 BCx: pending  UCx:    Sputum:    MRSA PCR:   Thank you for allowing pharmacy to be a part of this patient's care.  Allean Haas PharmD Clinical Pharmacist 06/01/2024

## 2024-06-01 NOTE — Consult Note (Signed)
 Subjective:  Patient ID: Destiny Luna, female    DOB: 29-Jul-1971,  MRN: [id number]  Patient with past medical history of type 2 diabetes, osteomyelitis, hyperlipidemia, morbid obesity seen at beside today for concern of left great toe wound and osteomyelitis. Patient relates she has a history of osteomyelitis in her toes and follows with and ID doctor in [place] who has worked with her to save these toes in the past. She has not had an issue in the past 6 years. She relates this wound started a couple weeks ago. She was seen in clinic with Dr. Janit he had started her on Bactrim  but had continued swelling and pain up into the leg which prompted her to come to the ED. She would like to avoid any surgery. She denies n/v/f/c. Relates the leg is feeling a lot better.    Past Medical History:  Diagnosis Date   Diabetes mellitus without complication [facility])      Past Surgical History:  Procedure Laterality Date   CESAREAN SECTION     HERNIA REPAIR         Latest Ref Rng & Units 06/01/2024    5:18 AM 05/31/2024    1:21 PM 05/30/2024    3:30 PM  CBC  WBC 4.0 - 10.5 K/uL 7.3  9.5  9.7   Hemoglobin 12.0 - 15.0 g/dL 88.9  88.2  87.5   Hematocrit 36.0 - 46.0 % 34.6  37.6  38.8   Platelets 150 - 400 K/uL 278  304  331        Latest Ref Rng & Units 06/01/2024    5:18 AM 05/31/2024    1:21 PM 04/03/2024   10:53 AM  BMP  Glucose 70 - 99 mg/dL 809  798  803   BUN 6 - 20 mg/dL 24  25  26    Creatinine 0.44 - 1.00 mg/dL 8.88  8.73  8.71   BUN/Creat Ratio 6 - 22 (calc)   20   Sodium 135 - 145 mmol/L 136  135  139   Potassium 3.5 - 5.1 mmol/L 4.1  4.6  5.8   Chloride 98 - 111 mmol/L 104  102  104   CO2 22 - 32 mmol/L 23  24  27    Calcium 8.9 - 10.3 mg/dL 8.3  8.7  9.8      Objective:   Vitals:   06/01/24 0424 06/01/24 0822  BP: 132/73 122/75  Pulse: (!) 103 (!) 106  Resp: 16 16  Temp: 97.7 F (36.5 C) 98.9 F (37.2 C)  SpO2: 98% 100%    General:AA&O x 3. Normal mood and affect    Vascular: DP and PT pulses 2/4 bilateral. Brisk capillary refill to all digits. Pedal hair present   Neruological. Epicritic sensation grossly intact.   Derm: Left distal hallux wound with granular fibrinous base measuring 1 cm x 1.5 cm x 0.4 cm . No erythema or edema present. No purulence. Does probe to bone. Interspaces clears of maceration. Nails well groomed and normal in appearance  MSK: MMT 5/5 in dorsiflexion, plantar flexion, inversion and eversion. Normal joint ROM without pain or crepitus.       Radiograph left foot.  IMPRESSION: 1. New extensive osteolysis and probable pathologic fracture involving the distal aspect of the left 1st distal phalanx, consistent with osteomyelitis. 2. Stable chronic erosive changes involving the distal 2nd and 3rd phalanges. 3. Stable chronic degenerative changes in the midfoot and 1st metatarsophalangeal joint.     Assessment &  Plan:  Patient was evaluated and treated and all questions answered.  DX: Left hallux osteomyelitis  Wound care: betadine, DSD  Antibiotics: Per primary . Recommend ID consultation for long term antibiotics.  Culture obtained today at bedside.  DME: Post-op shoe.   Discussed with patient diagnosis and treatment options.  Imaging reviewed. X-ray reviewed with obvious erosion and osteolysis from previous images 6/27.  Discussed with patient concern for bone infection and treatment options. Discussed with patient in depth amputation of toe. Patient has had experience in the past healing with just antibiotics and would really like to avoid amputation. She is stable at this time and do feel since success in the past for many years she has a change to heal with abx. Culture taken today of the toe down to bone.  Recommend ID consult for likely PICC line and 6 weeks abx.  Will plan for follow-up wound care in clinic upon ID recommendations. No plans for surgical intervention Patient in agreement with plan and all  questions answered.  Podiatry to sign off for now.    Asberry Failing, DPM  Accessible via secure chat for questions or concerns.

## 2024-06-02 DIAGNOSIS — M869 Osteomyelitis, unspecified: Secondary | ICD-10-CM | POA: Diagnosis not present

## 2024-06-02 LAB — CBC
HCT: 33.6 % — ABNORMAL LOW (ref 36.0–46.0)
Hemoglobin: 10.5 g/dL — ABNORMAL LOW (ref 12.0–15.0)
MCH: 25.6 pg — ABNORMAL LOW (ref 26.0–34.0)
MCHC: 31.3 g/dL (ref 30.0–36.0)
MCV: 82 fL (ref 80.0–100.0)
Platelets: 291 K/uL (ref 150–400)
RBC: 4.1 MIL/uL (ref 3.87–5.11)
RDW: 15.2 % (ref 11.5–15.5)
WBC: 7.8 K/uL (ref 4.0–10.5)
nRBC: 0 % (ref 0.0–0.2)

## 2024-06-02 LAB — GLUCOSE, CAPILLARY
Glucose-Capillary: 189 mg/dL — ABNORMAL HIGH (ref 70–99)
Glucose-Capillary: 223 mg/dL — ABNORMAL HIGH (ref 70–99)
Glucose-Capillary: 188 mg/dL — ABNORMAL HIGH (ref 70–99)
Glucose-Capillary: 152 mg/dL — ABNORMAL HIGH (ref 70–99)

## 2024-06-02 LAB — BASIC METABOLIC PANEL WITH GFR
Anion gap: 10 (ref 5–15)
BUN: 23 mg/dL — ABNORMAL HIGH (ref 6–20)
CO2: 20 mmol/L — ABNORMAL LOW (ref 22–32)
Calcium: 8.3 mg/dL — ABNORMAL LOW (ref 8.9–10.3)
Chloride: 103 mmol/L (ref 98–111)
Creatinine, Ser: 0.91 mg/dL (ref 0.44–1.00)
GFR, Estimated: >60 mL/min (ref >60)
Glucose, Bld: 222 mg/dL — ABNORMAL HIGH (ref 70–99)
Potassium: 3.9 mmol/L (ref 3.5–5.1)
Sodium: 133 mmol/L — ABNORMAL LOW (ref 135–145)

## 2024-06-02 MED ORDER — INSULIN GLARGINE-YFGN 100 UNIT/ML ~~LOC~~ SOLN
50.0000 [IU] | Freq: Every day | SUBCUTANEOUS | Status: DC
  Administered 2024-06-03: 50 [IU] via SUBCUTANEOUS
  Filled 2024-06-02: qty 0.5

## 2024-06-02 MED ORDER — VANCOMYCIN HCL 750 MG/150ML IV SOLN
750.0000 mg | Freq: Two times a day (BID) | INTRAVENOUS | Status: DC
  Administered 2024-06-02 – 2024-06-03 (×2): 750 mg via INTRAVENOUS
  Filled 2024-06-02 (×2): qty 150

## 2024-06-02 MED ORDER — INSULIN GLARGINE-YFGN 100 UNIT/ML ~~LOC~~ SOLN
10.0000 [IU] | Freq: Once | SUBCUTANEOUS | Status: AC
  Administered 2024-06-02: 10 [IU] via SUBCUTANEOUS
  Filled 2024-06-02: qty 0.1

## 2024-06-02 NOTE — Plan of Care (Signed)

## 2024-06-02 NOTE — Progress Notes (Signed)
 Pharmacy Antibiotic Note  Destiny Luna is a 53 y.o. female admitted on 05/31/2024 with left foot ulcer/ osteomyelitis.  Pharmacy has been consulted for Vancomycin  dosing.  -per H&P: sees an Infectious Disease MD in [place]. hx OM in multiple phalanges of both feet , no infections fo rthe last 5 yrs  -evaluated by her podiatrist on June 27, started on Bactrim . Went back to work 7/30. A few days later, she started noticing some tightness and pain in her foot so she followed up with her PCP 7/3. She was started on Levaquin  and referred to outpt ID. Returned to ED 7/4 -also on Ceftriaxone  and Metronidazole   Plan: Vancomycin  1 gm IV X 1 given in ED on 7/4 @ 1940.  Additional Vanc 1500 mg IV X 1 ordered to make total loading dose of 2500 mg.  - Scr 1.11 >> 0.91   Will adjust Vancomycin  from 1500mg  q24h to 750 mg IV q12h  AUC 435 Cmin 13.3 Scr 0.91 BMI 43  Vd 0.5  Continue to assess renal fxn, cultures and length of therapy, etc   Height: 5' 5 (165.1 cm) Weight: 117.9 kg (260 lb) IBW/kg (Calculated) : 57  Temp (24hrs), Avg:98.1 F (36.7 C), Min:98 F (36.7 C), Max:98.2 F (36.8 C)  Recent Labs  Lab 05/30/24 1530 05/31/24 1321 05/31/24 1641 05/31/24 2054 06/01/24 0518 06/02/24 0437  WBC 9.7 9.5  --   --  7.3 7.8  CREATININE  --  1.26*  --   --  1.11* 0.91  LATICACIDVEN  --   --  2.1* 1.3  --   --     Estimated Creatinine Clearance: 92.9 mL/min (by C-G formula based on SCr of 0.91 mg/dL).    No Known Allergies  Antimicrobials this admission: Vancomycin  7/4  (evening)>>  Ceftriaxone  7/4  (evening)>>  Metronidazole  7/4  (evening)>>  Dose adjustments this admission: 7/6 vanc 1500mg  q24h to 750 mg q12h  Microbiology results: 7/4 BCx: NGTD 7/5 Wcx: NG,12 hrs  UCx:    Sputum:    MRSA PCR:   Thank you for allowing pharmacy to be a part of this patient's care.  Allean Haas PharmD Clinical Pharmacist 06/02/2024

## 2024-06-02 NOTE — Plan of Care (Signed)
  Problem: Education: Goal: Knowledge of General Education information will improve Description: Including pain rating scale, medication(s)/side effects and non-pharmacologic comfort measures Outcome: Progressing   Problem: Health Behavior/Discharge Planning: Goal: Ability to manage health-related needs will improve Outcome: Progressing   Problem: Clinical Measurements: Goal: Ability to maintain clinical measurements within normal limits will improve Outcome: Progressing Goal: Respiratory complications will improve Outcome: Progressing   Problem: Activity: Goal: Risk for activity intolerance will decrease Outcome: Progressing   Problem: Coping: Goal: Level of anxiety will decrease Outcome: Progressing   Problem: Elimination: Goal: Will not experience complications related to bowel motility Outcome: Progressing   Problem: Pain Managment: Goal: General experience of comfort will improve and/or be controlled Outcome: Progressing   Problem: Safety: Goal: Ability to remain free from injury will improve Outcome: Progressing   Problem: Fluid Volume: Goal: Ability to maintain a balanced intake and output will improve Outcome: Progressing

## 2024-06-02 NOTE — Progress Notes (Signed)
 PROGRESS NOTE    Destiny Luna  FMW:[id number] DOB: Jun 12, 1971 DOA: 05/31/2024 PCP: Gareth Mliss FALCON, FNP     Brief Narrative:   From admission h and p  Destiny Luna is a 53 y.o. female with medical history significant for type 2 diabetes, osteomyelitis, hyperlipidemia, morbid obesity and left foot ulcer presented to the ED for evaluation of worsening left foot ulcer. 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 After wearing the shoe for a few days, she noticed a small bruise on the side of her left big toe that started peeling. She clipped off the peel but a few days later she started noticing swelling around her left big toe and of her foot.  She was evaluated by her podiatrist on June 27, she had an x-ray of the foot, started on Bactrim . She noticed some improvement in her foot so she went back to work on Monday. A few days later, she started noticing some tightness and pain in her foot so she followed up with her PCP yesterday. She was started on Levaquin  and referred to outpatient ID. Today, she noticed worsening of her foot pain with swelling that was progressing up her foot and ankles so she presented to the ED for further evaluation. She denies any fevers, chills, nausea, abdominal pain, chest pain, dizziness, headache, trauma or falls. States she has a history of osteomyelitis in multiple phalanges of both feet and they have all been managed with antibiotics by her infectious disease doctor in [place]. She has not had any infections for the last 5 years.   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 Here failed outpatient bactrim  and was started on levofloxacin  2 days ago. Here x-ray shows extensive osteolysis and probable pathologic fracture involving the distal aspect of the left 1st distal phalanx, consistent w/ osteomyelitis. Clinically signs of infection with tenderness and swelling tracking up medial thigh with  left groin lymphadenopathy on u/s (which was negative for DVT). Strong palpable pulses. Pain improving with antibiotics. Has declined amputation from podiatry. Also declines oral abx. Clinically she is improving - continue vancomycin , ceftriaxone , metronidazole  - follow wound culture (ngtd), blood cultures (ngtd) - formal ID consult tomorrow, probably picc line, home with iv abx, etc.  # T2DM History poor control, here A1c 7.5. On degludec 60 units daily, mounjaro  - SSI - semglee  40>50  # Recent cataract surgery - resume home pred forte   # Obesity noted   DVT prophylaxis: lovenox  Code Status: full Family Communication: none at bedside  Level of care: Med-Surg Status is: Inpatient Remains inpatient appropriate because: severity of illness    Consultants:  podiatry  Procedures: None    Antimicrobials:  See above    Subjective: Reports improving pain in foot and leg for two days now  Objective: Vitals:   06/01/24 1615 06/01/24 2036 06/02/24 0539 06/02/24 0822  BP: 129/63 129/85 132/80 126/79  Pulse: 99 (!) 105 95 100  Resp: 16 18 19 17   Temp: 98.2  F (36.8 C) 98.1 F (36.7 C) 98.1 F (36.7 C) 98 F (36.7 C)  TempSrc:  Oral    SpO2: 100% 100% 98% 98%  Weight:      Height:        Intake/Output Summary (Last 24 hours) at 06/02/2024 1250 Last data filed at 06/01/2024 2335 Gross per 24 hour  Intake 707.47 ml  Output --  Net 707.47 ml   Filed Weights   05/31/24 1312  Weight: 117.9 kg    Examination:  General exam:  Appears calm and comfortable  Respiratory system: Clear to auscultation. Respiratory effort normal. Cardiovascular system: S1 & S2 heard, RRR. No JVD, murmurs, rubs, gallops or clicks. No pedal edema. Gastrointestinal system: Abdomen is nondistended, obese, soft.  Central nervous system: Alert and oriented. No focal neurological deficits. Extremities: mild swelling left leg, resolved medial tenderness, toe is wrapped Skin: ulcer over distal surface of left 1st toe Psychiatry: Judgement and insight appear normal. Mood & affect appropriate.     Data Reviewed: I have personally reviewed following labs and imaging studies  CBC: Recent Labs  Lab 05/30/24 1530 05/31/24 1321 06/01/24 0518 06/02/24 0437  WBC 9.7 9.5 7.3 7.8  NEUTROABS 4,695 5.0  --   --   HGB 12.4 11.7* 11.0* 10.5*  HCT 38.8 37.6 34.6* 33.6*  MCV 81.7 82.5 81.4 82.0  PLT 331 304 278 291   Basic Metabolic Panel: Recent Labs  Lab 05/31/24 1321 06/01/24 0518 06/02/24 0437  NA 135 136 133*  K 4.6 4.1 3.9  CL 102 104 103  CO2 24 23 20*  GLUCOSE 201* 190* 222*  BUN 25* 24* 23*  CREATININE 1.26* 1.11* 0.91  CALCIUM 8.7* 8.3* 8.3*   GFR: Estimated Creatinine Clearance: 92.9 mL/min (by C-G formula based on SCr of 0.91 mg/dL). Liver Function Tests: Recent Labs  Lab 05/31/24 1321  AST 13*  ALT 15  ALKPHOS 93  BILITOT 0.4  PROT 7.4  ALBUMIN 3.3*   No results for input(s): LIPASE, AMYLASE in the last 168 hours. No results for input(s): AMMONIA in the last 168 hours. Coagulation Profile: No results for input(s): INR, PROTIME in the last 168 hours. Cardiac Enzymes: No results for input(s): CKTOTAL, CKMB, CKMBINDEX, TROPONINI in the last 168 hours. BNP (last 3 results) No results for input(s): PROBNP in the last 8760 hours. HbA1C: Recent Labs    06/01/24 0518  HGBA1C 7.5*   CBG: Recent Labs  Lab 06/01/24 1205 06/01/24 1705 06/01/24 2138 06/02/24 0819 06/02/24 1152  GLUCAP 169*  165* 183* 189* 223*   Lipid Profile: No results for input(s): CHOL, HDL, LDLCALC, TRIG, CHOLHDL, LDLDIRECT in the last 72 hours. Thyroid Function Tests: No results for input(s): TSH, T4TOTAL, FREET4, T3FREE, THYROIDAB in the last 72 hours. Anemia Panel: No results for input(s): VITAMINB12, FOLATE, FERRITIN, TIBC, IRON, RETICCTPCT in the last 72 hours. Urine analysis:    Component Value Date/Time   LABSPEC >=1.030 06/03/2021 1907   PHURINE 6.5 06/03/2021 1907   GLUCOSEU NEGATIVE 06/03/2021 1907   HGBUR SMALL (A) 06/03/2021 1907   BILIRUBINUR NEGATIVE 06/03/2021 1907   KETONESUR NEGATIVE 06/03/2021 1907   PROTEINUR >=300 (A) 06/03/2021 1907   UROBILINOGEN 0.2 06/03/2021 1907   NITRITE POSITIVE (A) 06/03/2021 1907   LEUKOCYTESUR NEGATIVE 06/03/2021 1907   Sepsis Labs: @LABRCNTIP (procalcitonin:4,lacticidven:4)  ) Recent Results (from the past 240 hours)  WOUND CULTURE     Status: None   Collection Time: 05/24/24 12:23 PM  Result Value Ref Range Status  MICRO NUMBER: [id number]  Final   SPECIMEN QUALITY: Adequate  Final   SOURCE: WOUND  Final   STATUS: FINAL  Final   GRAM STAIN: Gram-positive cocci in chains  Final    Comment: No white blood cells seen No epithelial cells seen Many Gram positive cocci in clusters Many Gram positive cocci in chains Few Gram negative bacilli   RESULT:   Final    A mix of organisms of questionable significance was recovered on culture and not further identified. (Note: Growth did not detect the presence of S.aureus, beta-hemolytic Streptococci or P.aeruginosa).  Culture, blood (Routine X 2) w Reflex to ID Panel     Status: None (Preliminary result)   Collection Time: 05/31/24 10:13 PM   Specimen: BLOOD  Result Value Ref Range Status   Specimen Description BLOOD BLOOD LEFT HAND  Final   Special Requests   Final    BOTTLES DRAWN AEROBIC AND ANAEROBIC Blood Culture adequate volume   Culture   Final    NO GROWTH 2  DAYS Performed at [facility], [address] [place], [place] [address]    Report Status PENDING  Incomplete  Culture, blood (Routine X 2) w Reflex to ID Panel     Status: None (Preliminary result)   Collection Time: 05/31/24 10:13 PM   Specimen: BLOOD  Result Value Ref Range Status   Specimen Description BLOOD BLOOD RIGHT HAND  Final   Special Requests   Final    Blood Culture results may not be optimal due to an inadequate volume of blood received in culture bottles   Culture   Final    NO GROWTH 2 DAYS Performed at [facility], [address], [place], [place] [address]    Report Status PENDING  Incomplete  Aerobic/Anaerobic Culture w Gram Stain (surgical/deep wound)     Status: None (Preliminary result)   Collection Time: 06/01/24  3:57 PM   Specimen: Wound; Tissue  Result Value Ref Range Status   Specimen Description   Final    WOUND Performed at [facility], [address], [place], [place] [address]    Special Requests   Final    LEFT FOOT Performed at [facility], [address] [place], [place] [address]    Gram Stain   Final    RARE WBC PRESENT, PREDOMINANTLY PMN NO ORGANISMS SEEN    Culture   Final    NO GROWTH < 12 HOURS Performed at [facility], [address] [place], [place] [address]    Report Status PENDING  Incomplete         Radiology Studies: US  Venous Img Lower Unilateral Left Result Date: 05/31/2024 CLINICAL DATA:  Foot infection with increased pain EXAM: Left LOWER EXTREMITY VENOUS DOPPLER ULTRASOUND TECHNIQUE: Gray-scale sonography with compression, as well as color and duplex ultrasound, were performed to evaluate the deep venous system(s) from the level of the common femoral vein through the popliteal and proximal calf veins. COMPARISON:  None Available. FINDINGS: VENOUS Normal compressibility of the common femoral, superficial femoral, and popliteal veins, as well as the visualized calf  veins. Visualized portions of profunda femoral vein and great saphenous vein unremarkable. No filling defects to suggest DVT on grayscale or color Doppler imaging. Doppler waveforms show normal direction of venous flow, normal respiratory plasticity and response to augmentation. Limited views of the contralateral common femoral vein are unremarkable. OTHER Enlarged left groin lymph node measuring 2.8 x 4.9 x 1.2 cm Limitations:  none IMPRESSION: 1. Negative for acute left lower extremity DVT. 2. Enlarged left groin lymph node Electronically Signed   By: Luke Bun M.D.   On: 05/31/2024 18:58   DG Foot 2 Views Left Result Date: 05/31/2024 CLINICAL DATA:  Diabetic with great toe infection. Evaluate for osteomyelitis. EXAM: LEFT FOOT - 2 VIEW COMPARISON:  Radiograph 05/24/2024 and 09/04/2023. FINDINGS: New extensive osteolysis and probable pathologic fracture involving the distal aspect of the left 1st distal phalanx. Stable chronic erosive changes involving the distal 2nd and 3rd phalanges and hammertoe deformities. Stable chronic degenerative changes in the midfoot and at the 1st metatarsophalangeal joint. There are prominent bidirectional calcaneal spurs and mild forefoot soft tissue swelling. No evidence of foreign body or soft tissue emphysema. IMPRESSION: 1. New extensive osteolysis and probable pathologic fracture involving the distal aspect of the left 1st distal phalanx, consistent with osteomyelitis. 2. Stable chronic erosive changes involving the distal 2nd and 3rd phalanges. 3. Stable chronic degenerative changes in the midfoot and 1st metatarsophalangeal joint. Electronically Signed   By: Elsie Perone M.D.   On: 05/31/2024 18:06        Scheduled Meds:  enoxaparin  (LOVENOX ) injection  0.5 mg/kg Subcutaneous Q24H   insulin  aspart  0-15 Units Subcutaneous TID WC   insulin  aspart  0-5 Units Subcutaneous QHS   insulin  glargine-yfgn  40 Units Subcutaneous Daily   ketorolac   1 drop Right Eye  QID   prednisoLONE  acetate  1 drop Right Eye QID   Continuous Infusions:  cefTRIAXone  (ROCEPHIN )  IV Stopped (06/01/24 2233)   metronidazole  500 mg (06/02/24 0850)   vancomycin        LOS: 2 days     Devaughn KATHEE Ban, MD [facility]   If 7PM-7AM, please contact night-coverage [email or web address] Password [email or web address] 06/02/2024, 12:50 PM

## 2024-06-03 ENCOUNTER — Other Ambulatory Visit: Payer: Self-pay

## 2024-06-03 ENCOUNTER — Other Ambulatory Visit: Payer: Self-pay | Admitting: Infectious Diseases

## 2024-06-03 ENCOUNTER — Ambulatory Visit: Payer: Self-pay | Admitting: Nurse Practitioner

## 2024-06-03 DIAGNOSIS — L97519 Non-pressure chronic ulcer of other part of right foot with unspecified severity: Secondary | ICD-10-CM

## 2024-06-03 DIAGNOSIS — E1142 Type 2 diabetes mellitus with diabetic polyneuropathy: Secondary | ICD-10-CM

## 2024-06-03 DIAGNOSIS — M86171 Other acute osteomyelitis, right ankle and foot: Secondary | ICD-10-CM | POA: Diagnosis not present

## 2024-06-03 DIAGNOSIS — Z794 Long term (current) use of insulin: Secondary | ICD-10-CM

## 2024-06-03 DIAGNOSIS — E11621 Type 2 diabetes mellitus with foot ulcer: Secondary | ICD-10-CM | POA: Diagnosis not present

## 2024-06-03 DIAGNOSIS — E1165 Type 2 diabetes mellitus with hyperglycemia: Secondary | ICD-10-CM

## 2024-06-03 DIAGNOSIS — M869 Osteomyelitis, unspecified: Secondary | ICD-10-CM | POA: Diagnosis not present

## 2024-06-03 LAB — GLUCOSE, CAPILLARY
Glucose-Capillary: 150 mg/dL — ABNORMAL HIGH (ref 70–99)
Glucose-Capillary: 151 mg/dL — ABNORMAL HIGH (ref 70–99)

## 2024-06-03 LAB — CBC
HCT: 35.4 % — ABNORMAL LOW (ref 36.0–46.0)
Hemoglobin: 10.8 g/dL — ABNORMAL LOW (ref 12.0–15.0)
MCH: 25.4 pg — ABNORMAL LOW (ref 26.0–34.0)
MCHC: 30.5 g/dL (ref 30.0–36.0)
MCV: 83.1 fL (ref 80.0–100.0)
Platelets: 305 K/uL (ref 150–400)
RBC: 4.26 MIL/uL (ref 3.87–5.11)
RDW: 15.1 % (ref 11.5–15.5)
WBC: 7.3 K/uL (ref 4.0–10.5)
nRBC: 0 % (ref 0.0–0.2)

## 2024-06-03 LAB — BASIC METABOLIC PANEL WITH GFR
Anion gap: 8 (ref 5–15)
BUN: 25 mg/dL — ABNORMAL HIGH (ref 6–20)
CO2: 21 mmol/L — ABNORMAL LOW (ref 22–32)
Calcium: 8.5 mg/dL — ABNORMAL LOW (ref 8.9–10.3)
Chloride: 106 mmol/L (ref 98–111)
Creatinine, Ser: 1 mg/dL (ref 0.44–1.00)
GFR, Estimated: >60 mL/min (ref >60)
Glucose, Bld: 175 mg/dL — ABNORMAL HIGH (ref 70–99)
Potassium: 3.8 mmol/L (ref 3.5–5.1)
Sodium: 135 mmol/L (ref 135–145)

## 2024-06-03 LAB — CK: Total CK: 30 U/L — ABNORMAL LOW (ref 38–234)

## 2024-06-03 MED ORDER — METRONIDAZOLE 500 MG PO TABS: 500.0000 mg | ORAL_TABLET | Freq: Two times a day (BID) | ORAL | Status: DC

## 2024-06-03 MED ORDER — DAPTOMYCIN-SODIUM CHLORIDE 700-0.9 MG/100ML-% IV SOLN
8.0000 mg/kg | Freq: Every day | INTRAVENOUS | Status: DC
  Administered 2024-06-03: 700 mg via INTRAVENOUS
  Filled 2024-06-03: qty 100

## 2024-06-03 MED ORDER — SODIUM CHLORIDE 0.9% FLUSH
10.0000 mL | Freq: Two times a day (BID) | INTRAVENOUS | Status: DC
  Administered 2024-06-03: 10 mL

## 2024-06-03 MED ORDER — CHLORHEXIDINE GLUCONATE CLOTH 2 % EX PADS
6.0000 | MEDICATED_PAD | Freq: Every day | CUTANEOUS | Status: DC
  Administered 2024-06-03: 6 via TOPICAL

## 2024-06-03 MED ORDER — SODIUM CHLORIDE 0.9% FLUSH: 10.0000 mL | INTRAVENOUS | Status: DC | PRN

## 2024-06-03 MED ORDER — CEFTRIAXONE IV (FOR PTA / DISCHARGE USE ONLY): 2.0000 g | INTRAVENOUS | 0 refills | Status: AC

## 2024-06-03 MED ORDER — DAPTOMYCIN IV (FOR PTA / DISCHARGE USE ONLY): 700.0000 mg | INTRAVENOUS | 0 refills | Status: AC

## 2024-06-03 MED ORDER — METRONIDAZOLE 500 MG PO TABS
500.0000 mg | ORAL_TABLET | Freq: Two times a day (BID) | ORAL | 0 refills | Status: AC
  Filled 2024-06-03: qty 78, 39d supply, fill #0

## 2024-06-03 NOTE — Plan of Care (Signed)
  Problem: Clinical Measurements: Goal: Diagnostic test results will improve Outcome: Progressing   Problem: Nutrition: Goal: Adequate nutrition will be maintained Outcome: Progressing   Problem: Coping: Goal: Ability to adjust to condition or change in health will improve Outcome: Progressing

## 2024-06-03 NOTE — Discharge Summary (Signed)
 Destiny Luna FMW:[id number] DOB: [birth year] DOA: 05/31/2024  PCP: Gareth Mliss FALCON, FNP  Admit date: 05/31/2024 Discharge date: 06/03/2024  Time spent: 35 minutes  Recommendations for Outpatient Follow-up:  Podiatry f/u 7/18 as scheduled ID f/u 7/22 as scheduled PCP f/u as well     Discharge Diagnoses:  Principal Problem:   Osteomyelitis of great toe of left foot (HCC) Active Problems:   Morbid obesity (HCC)   Diabetes mellitus (HCC)   AKI (acute kidney injury) (HCC)   Diabetic ulcer of toe of left foot associated with type 2 diabetes mellitus, with bone involvement without evidence of necrosis [name]   Discharge Condition: stable  Diet recommendation: carb modified  Filed Weights   05/31/24 1312  Weight: 117.9 kg    History of present illness:  From admission h and p Destiny Luna is a 53 y.o. female with medical history significant for type 2 diabetes, osteomyelitis, hyperlipidemia, morbid obesity and left foot ulcer presented to the ED for evaluation of worsening left foot ulcer. 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 After wearing the shoe for a few days, she noticed a small bruise on the side of her left big toe that started peeling. She clipped off the peel but a few days later she started noticing swelling around her left big toe and of her foot.  She was evaluated by her podiatrist on June 27, she had an x-ray of the foot, started on Bactrim . She noticed some improvement in her foot so she went back to work on Monday. A few days later, she started noticing some tightness and pain in her foot so she followed up with her PCP yesterday. She was started on Levaquin  and referred to outpatient ID. Today, she noticed worsening of her foot pain with swelling that was progressing up her foot and ankles so she presented to the ED for further evaluation. She denies any fevers, chills, nausea, abdominal pain, chest  pain, dizziness, headache, trauma or falls. States she has a history of osteomyelitis in multiple phalanges of both feet and they have all been managed with antibiotics by her infectious disease doctor in [place]. She has not had any infections for the last 5 years.   Hospital Course:   # Osteomyelitis left first toe History of osteo in her toes, says always managed with antibiotics with her infectious disease specialist in [place]. Here failed outpatient bactrim  and was started on levofloxacin  2 days prior to presentation. Here x-ray shows extensive osteolysis and probable pathologic fracture involving the distal aspect of the left 1st distal phalanx, consistent w/ osteomyelitis. Clinically signs of infection with tenderness and swelling tracking up medial thigh with  left groin lymphadenopathy on u/s (which was negative for DVT). Strong palpable pulses. Cellulitis much improved with antibiotics. Has declined amputation from podiatry. Also declines oral abx. Treated with vanc/ceftriaxone /flagyl  here. Picc placed 7/7. ID consulted, opat orders as below. Has ID and podiatry f/u scheduled.   OPAT Orders Discharge antibiotics: Daptomycin  700mg  IV every 24 hrs   And ceftriaxone  2 grams Iv every 24 hours + flagyl  500mg  PO BID     End date- 06/28/24 May extend for another 2 weeks     [facility] Care Per Protocol:   Labs weekly while on IV antibiotics: _X_ CBC with differential _X_ CK _X_ CMP _X_ CRP _X_ ESR       _X_ Please leave PIC in place until doctor has seen  patient or been notified   Fax weekly lab results  promptly to [phone]   Clinic Follow Up Appt: 06/18/24 at 9.30 am with Dr.Ravishankar     Call [phone] with any questions or critical values   # T2DM History poor control, here A1c 7.5. On degludec 60 units daily, mounjaro   # Recent cataract surgery - home eye drops   Procedures: none   Consultations: Podiatry, ID  Discharge Exam: Vitals:   06/03/24  0400 06/03/24 0805  BP: 119/60 136/89  Pulse: 93 98  Resp: 16 18  Temp: 98.2 F (36.8 C) 98.1 F (36.7 C)  SpO2: 96% 100%    General exam: Appears calm and comfortable  Respiratory system: Clear to auscultation. Respiratory effort normal. Cardiovascular system: S1 & S2 heard, RRR. No JVD, murmurs, rubs, gallops or clicks. No pedal edema. Gastrointestinal system: Abdomen is nondistended, obese, soft.  Central nervous system: Alert and oriented. No focal neurological deficits. Extremities: mild swelling left leg, resolved medial tenderness, toe is wrapped Skin: ulcer over distal surface of left 1st toe Psychiatry: Judgement and insight appear normal. Mood & affect appropriate.   Discharge Instructions   Discharge Instructions     [facility] pharmacist to adjust dose for Vancomycin , Aminoglycosides and other anti-infective therapies as requested by physician.   Complete by: As directed    [facility] to provide Cath Flo 2mg    Complete by: As directed    Administer for PICC line occlusion and as ordered by physician for other access device issues.   Anaphylaxis Kit: Provided to treat any anaphylactic reaction to the medication being provided to the patient if First Dose or when requested by physician   Complete by: As directed    Epinephrine  1mg /ml vial / amp: Administer 0.3mg  (0.36ml) subcutaneously once for moderate to severe anaphylaxis, nurse to call physician and pharmacy when reaction occurs and call 911 if needed for immediate care   Diphenhydramine  50mg /ml IV vial: Administer 25-50mg  IV/IM PRN for first dose reaction, rash, itching, mild reaction, nurse to call physician and pharmacy when reaction occurs   Sodium Chloride  0.9% NS 500ml IV: Administer if needed for hypovolemic blood pressure drop or as ordered by physician after call to physician with anaphylactic reaction   Change dressing on IV access line weekly and PRN   Complete by: As directed     Diet - low sodium heart healthy   Complete by: As directed    Discharge wound care:   Complete by: As directed    Betadine dressings   Flush IV access with Sodium Chloride  0.9% and Heparin 10 units/ml or 100 units/ml   Complete by: As directed    Home infusion instructions - [facility]   Complete by: As directed    Instructions: Flush IV access with Sodium Chloride  0.9% and Heparin 10units/ml or 100units/ml   Change dressing on IV access line: Weekly and PRN   Instructions Cath Flo 2mg : Administer for PICC Line occlusion and as ordered by physician for other access device   [facility] pharmacist to adjust dose for: Vancomycin , Aminoglycosides and other anti-infective therapies as requested by physician   Increase activity slowly   Complete by: As directed    Method of administration may be changed at the discretion of home infusion pharmacist based upon assessment of the patient and/or caregiver's ability to self-administer the medication ordered   Complete by: As directed       Allergies as of 06/03/2024  No Known Allergies      Medication List     STOP taking these medications    levofloxacin  750 MG tablet Commonly known as: Levaquin    sulfamethoxazole -trimethoprim  800-160 MG tablet Commonly known as: BACTRIM  DS       TAKE these medications    cefTRIAXone  IVPB Commonly known as: ROCEPHIN  Inject 2 g into the vein daily for 24 days. Indication:  diabetic foot osteomyelitis L first hallux First Dose: Yes Last Day of Therapy:  06/28/2024 Labs - Once weekly:  CBC/D, CMP, and CPK Labs - Once weekly: ESR and CRP Fax weekly lab results  promptly to [phone] Method of administration: IV Push Method of administration may be changed at the discretion of home infusion pharmacist based upon assessment of the patient and/or caregiver's ability to self-administer the medication ordered. Call [phone] with any questions or critical values Please  leave PIC in place until doctor has seen patient or been notified Start taking on: June 04, 2024   daptomycin  IVPB Commonly known as: CUBICIN  Inject 700 mg into the vein daily for 24 days. Indication:  diabetic foot osteomyelitis L first hallux First Dose: Yes Last Day of Therapy:  06/28/2024 Labs - Once weekly:  CBC/D, CMP, and CPK Labs - Once weekly: ESR and CRP Fax weekly lab results  promptly to [phone] Method of administration: IV Push Method of administration may be changed at the discretion of home infusion pharmacist based upon assessment of the patient and/or caregiver's ability to self-administer the medication ordered. Call [phone] with any questions or critical values Please leave PIC in place until doctor has seen patient or been notified Start taking on: June 04, 2024   gentamicin  cream 0.1 % Commonly known as: GARAMYCIN  Apply 1 Application topically 2 (two) times daily.   Insulin  Pen Needle 32G X 6 MM Misc 1 each by Does not apply route daily.   ketorolac  0.5 % ophthalmic solution Commonly known as: ACULAR  Place 1 drop into the right eye 4 (four) times daily.   methocarbamol  500 MG tablet Commonly known as: ROBAXIN  Take 1 tablet (500 mg total) by mouth 2 (two) times daily.   metroNIDAZOLE  500 MG tablet Commonly known as: FLAGYL  Take 1 tablet (500 mg total) by mouth every 12 (twelve) hours.   Mounjaro  10 MG/0.5ML Pen Generic drug: tirzepatide  Inject 10 mg into the skin once a week.   naproxen  500 MG tablet Commonly known as: NAPROSYN  Take 1 tablet (500 mg total) by mouth 2 (two) times daily with a meal.   prednisoLONE  acetate 1 % ophthalmic suspension Commonly known as: PRED FORTE  Place 1 drop into the right eye 4 (four) times daily.   Tresiba  FlexTouch 100 UNIT/ML FlexTouch Pen Generic drug: insulin  degludec Inject 60 Units into the skin daily.               Discharge Care Instructions  (From admission, onward)           Start      Ordered   06/03/24 0000  Change dressing on IV access line weekly and PRN  (Home infusion instructions - [facility] )        06/03/24 1343   06/03/24 0000  Discharge wound care:       Comments: Betadine dressings   06/03/24 1343           No Known Allergies  Follow-up Information     Gareth Mliss FALCON, FNP Follow up.   Specialty: Nurse Practitioner Contact  information: [address] [place] [address] [phone]                  The results of significant diagnostics from this hospitalization (including imaging, microbiology, ancillary and laboratory) are listed below for reference.    Significant Diagnostic Studies: US  EKG SITE RITE Result Date: 06/03/2024 If Site Rite image not attached, placement could not be confirmed due to current cardiac rhythm.  US  Venous Img Lower Unilateral Left Result Date: 05/31/2024 CLINICAL DATA:  Foot infection with increased pain EXAM: Left LOWER EXTREMITY VENOUS DOPPLER ULTRASOUND TECHNIQUE: Gray-scale sonography with compression, as well as color and duplex ultrasound, were performed to evaluate the deep venous system(s) from the level of the common femoral vein through the popliteal and proximal calf veins. COMPARISON:  None Available. FINDINGS: VENOUS Normal compressibility of the common femoral, superficial femoral, and popliteal veins, as well as the visualized calf veins. Visualized portions of profunda femoral vein and great saphenous vein unremarkable. No filling defects to suggest DVT on grayscale or color Doppler imaging. Doppler waveforms show normal direction of venous flow, normal respiratory plasticity and response to augmentation. Limited views of the contralateral common femoral vein are unremarkable. OTHER Enlarged left groin lymph node measuring 2.8 x 4.9 x 1.2 cm Limitations: none IMPRESSION: 1. Negative for acute left lower extremity DVT. 2. Enlarged left groin lymph node Electronically  Signed   By: Luke Bun M.D.   On: 05/31/2024 18:58   DG Foot 2 Views Left Result Date: 05/31/2024 CLINICAL DATA:  Diabetic with great toe infection. Evaluate for osteomyelitis. EXAM: LEFT FOOT - 2 VIEW COMPARISON:  Radiograph 05/24/2024 and 09/04/2023. FINDINGS: New extensive osteolysis and probable pathologic fracture involving the distal aspect of the left 1st distal phalanx. Stable chronic erosive changes involving the distal 2nd and 3rd phalanges and hammertoe deformities. Stable chronic degenerative changes in the midfoot and at the 1st metatarsophalangeal joint. There are prominent bidirectional calcaneal spurs and mild forefoot soft tissue swelling. No evidence of foreign body or soft tissue emphysema. IMPRESSION: 1. New extensive osteolysis and probable pathologic fracture involving the distal aspect of the left 1st distal phalanx, consistent with osteomyelitis. 2. Stable chronic erosive changes involving the distal 2nd and 3rd phalanges. 3. Stable chronic degenerative changes in the midfoot and 1st metatarsophalangeal joint. Electronically Signed   By: Elsie Perone M.D.   On: 05/31/2024 18:06   DG Foot Complete Left Result Date: 05/24/2024 Please see detailed radiograph report in office note.   Microbiology: Recent Results (from the past 240 hours)  Culture, blood (Routine X 2) w Reflex to ID Panel     Status: None (Preliminary result)   Collection Time: 05/31/24 10:13 PM   Specimen: BLOOD  Result Value Ref Range Status   Specimen Description BLOOD BLOOD LEFT HAND  Final   Special Requests   Final    BOTTLES DRAWN AEROBIC AND ANAEROBIC Blood Culture adequate volume   Culture   Final    NO GROWTH 3 DAYS Performed at [facility], [address], [place], [place] [address]    Report Status PENDING  Incomplete  Culture, blood (Routine X 2) w Reflex to ID Panel     Status: None (Preliminary result)   Collection Time: 05/31/24 10:13 PM   Specimen: BLOOD  Result Value Ref  Range Status   Specimen Description BLOOD BLOOD RIGHT HAND  Final   Special Requests   Final    Blood Culture results may not be optimal  due to an inadequate volume of blood received in culture bottles   Culture   Final    NO GROWTH 3 DAYS Performed at [facility], [address] [place], [place] [address]    Report Status PENDING  Incomplete  Aerobic/Anaerobic Culture w Gram Stain (surgical/deep wound)     Status: None (Preliminary result)   Collection Time: 06/01/24  3:57 PM   Specimen: Wound; Tissue  Result Value Ref Range Status   Specimen Description   Final    WOUND Performed at [facility], [address] [place], [place] [address]    Special Requests   Final    LEFT FOOT Performed at [facility], [address] [place], [place] [address]    Gram Stain   Final    RARE WBC PRESENT, PREDOMINANTLY PMN NO ORGANISMS SEEN Performed at [facility], [address], [place], [place] [address]    Culture   Final    RARE ENTEROCOCCUS FAECALIS RARE STREPTOCOCCUS ANGINOSIS SUSCEPTIBILITIES TO FOLLOW NO ANAEROBES ISOLATED; CULTURE IN PROGRESS FOR 5 DAYS    Report Status PENDING  Incomplete     Labs: Basic Metabolic Panel: Recent Labs  Lab 05/31/24 1321 06/01/24 0518 06/02/24 0437 06/03/24 0320  NA 135 136 133* 135  K 4.6 4.1 3.9 3.8  CL 102 104 103 106  CO2 24 23 20* 21*  GLUCOSE 201* 190* 222* 175*  BUN 25* 24* 23* 25*  CREATININE 1.26* 1.11* 0.91 1.00  CALCIUM 8.7* 8.3* 8.3* 8.5*   Liver Function Tests: Recent Labs  Lab 05/31/24 1321  AST 13*  ALT 15  ALKPHOS 93  BILITOT 0.4  PROT 7.4  ALBUMIN 3.3*   No results for input(s): LIPASE, AMYLASE in the last 168 hours. No results for input(s): AMMONIA in the last 168 hours. CBC: Recent Labs  Lab 05/30/24 1530 05/31/24 1321 06/01/24 0518 06/02/24 0437 06/03/24 0320  WBC 9.7 9.5 7.3 7.8 7.3  NEUTROABS 4,695 5.0  --   --   --   HGB 12.4 11.7* 11.0* 10.5*  10.8*  HCT 38.8 37.6 34.6* 33.6* 35.4*  MCV 81.7 82.5 81.4 82.0 83.1  PLT 331 304 278 291 305   Cardiac Enzymes: Recent Labs  Lab 06/03/24 0320  CKTOTAL 30*   BNP: BNP (last 3 results) No results for input(s): BNP in the last 8760 hours.  ProBNP (last 3 results) No results for input(s): PROBNP in the last 8760 hours.  CBG: Recent Labs  Lab 06/02/24 1152 06/02/24 1712 06/02/24 1940 06/03/24 0804 06/03/24 1156  GLUCAP 223* 188* 152* 150* 151*       Signed:  Devaughn KATHEE Ban MD.  [facility] 06/03/2024, 1:46 PM

## 2024-06-03 NOTE — Consult Note (Signed)
 NAME: Destiny Luna  DOB: Oct 08, 1971  MRN: [id number]  Date/Time: 06/03/2024 9:03 AM  REQUESTING PROVIDER: Dr.Wouk Subjective:  REASON FOR CONSULT: osteomyelitis of left great toe ? Destiny Luna is a 53 y.o. female with a history of DM, peripheral neuropathy presented with rt great toe ulcer of 3 weeks duration- pt has hammer toes and chronic callus on the toes- She is a flight attendant and recently got a size smaller shoe which caused a bruise on her great toe and she removed the dead skin which aggravated the toe- She saw her podiatrist as OP on 6/27 and was prescribed bactrim  after culture was sent.( No MRSA, pseudomonas or staph) As there was no improvement she returned on 7/2 and was prescribed levaquin . She came to the ED on 7/4 as she was worried about bone infection which she had in the same toe 5 years ago for which she received IV antibiotics  On 7/3 she had pain left toe and also going up he rleg She had no fever or chills  In the ED vitals  05/31/24 13:11  BP 141/97 (H)  Temp 98.4 F (36.9 C)  Pulse Rate 112 !  Resp 18  SpO2 99 %     Latest Reference Range & Units 05/31/24 13:21  WBC 4.0 - 10.5 K/uL 9.5  Hemoglobin 12.0 - 15.0 g/dL 88.2 (L)  HCT 63.9 - 53.9 % 37.6  Platelets 150 - 400 K/uL 304  Creatinine 0.44 - 1.00 mg/dL 8.73 (H)  Xray left foot showed New extensive osteolysis and probable pathological fracture involving the distal aspect of the left first distal phalanx consistent with osteomyelitis.  And there was stable chronic erosive changes involving the distal 2nd and 3rd phalanges. Started on broad spectrum IV antibiotics Seen by podiatrist and a culture was taken from the wound.  They recommended IV antibiotics for osteomyelitis.  Past Medical History:  Diagnosis Date   Diabetes mellitus without complication (HCC)     Past Surgical History:  Procedure Laterality Date   CESAREAN SECTION     HERNIA REPAIR      Social History   Socioeconomic  History   Marital status: Single    Spouse name: Not on file   Number of children: 1   Years of education: Not on file   Highest education level: Bachelor's degree (e.g., BA, AB, BS)  Occupational History   Not on file  Tobacco Use   Smoking status: Former    Current packs/day: 0.25    Average packs/day: 0.3 packs/day for 4.0 years (1.0 ttl pk-yrs)    Types: Cigarettes   Smokeless tobacco: Never   Tobacco comments:    quit for 8 years and restarted  Vaping Use   Vaping status: Never Used  Substance and Sexual Activity   Alcohol use: Not Currently    Comment: rarely   Drug use: Never   Sexual activity: Not Currently  Other Topics Concern   Not on file  Social History Narrative   Not on file   Social Drivers of Health   Financial Resource Strain: Not on file  Food Insecurity: No Food Insecurity (06/01/2024)   Hunger Vital Sign    Worried About Running Out of Food in the Last Year: Never true    Ran Out of Food in the Last Year: Never true  Transportation Needs: No Transportation Needs (06/01/2024)   PRAPARE - Administrator, Civil Service (Medical): No    Lack of Transportation (Non-Medical): No  Physical  Activity: Not on file  Stress: Not on file  Social Connections: Not on file  Intimate Partner Violence: Not At Risk (06/01/2024)   Humiliation, Afraid, Rape, and Kick questionnaire    Fear of Current or Ex-Partner: No    Emotionally Abused: No    Physically Abused: No    Sexually Abused: No    Family History  Problem Relation Age of Onset   Diabetes Mother    CAD Father        CABG x 3   Stroke Father    Diabetes Father    No Known Allergies I? Current Facility-Administered Medications  Medication Dose Route Frequency Provider Last Rate Last Admin   acetaminophen  (TYLENOL ) tablet 650 mg  650 mg Oral Q6H PRN Amponsah, Prosper M, MD   650 mg at 06/01/24 2157   Or   acetaminophen  (TYLENOL ) suppository 650 mg  650 mg Rectal Q6H PRN Amponsah, Prosper M,  MD       cefTRIAXone  (ROCEPHIN ) 2 g in sodium chloride  0.9 % 100 mL IVPB  2 g Intravenous Q24H Amponsah, Prosper M, MD   Stopped at 06/02/24 2148   enoxaparin  (LOVENOX ) injection 60 mg  0.5 mg/kg Subcutaneous Q24H Amponsah, Prosper M, MD   60 mg at 06/02/24 1942   insulin  aspart (novoLOG ) injection 0-15 Units  0-15 Units Subcutaneous TID WC Amponsah, Prosper M, MD   2 Units at 06/03/24 9173   insulin  aspart (novoLOG ) injection 0-5 Units  0-5 Units Subcutaneous QHS Lou Claretta HERO, MD   2 Units at 05/31/24 2216   insulin  glargine-yfgn (SEMGLEE ) injection 50 Units  50 Units Subcutaneous Daily Wouk, Devaughn Sayres, MD       ketorolac  (ACULAR ) 0.5 % ophthalmic solution 1 drop  1 drop Right Eye QID Kandis Devaughn Sayres, MD   1 drop at 06/02/24 2112   metroNIDAZOLE  (FLAGYL ) IVPB 500 mg  500 mg Intravenous Q12H Amponsah, Prosper M, MD   Stopped at 06/02/24 2308   ondansetron  (ZOFRAN ) tablet 4 mg  4 mg Oral Q6H PRN Amponsah, Prosper M, MD       Or   ondansetron  (ZOFRAN ) injection 4 mg  4 mg Intravenous Q6H PRN Lou Claretta HERO, MD       oxyCODONE  (Oxy IR/ROXICODONE ) immediate release tablet 5 mg  5 mg Oral Q6H PRN Wouk, Devaughn Sayres, MD       prednisoLONE  acetate (PRED FORTE ) 1 % ophthalmic suspension 1 drop  1 drop Right Eye QID Wouk, Devaughn Sayres, MD   1 drop at 06/02/24 2113   senna-docusate (Senokot-S) tablet 1 tablet  1 tablet Oral QHS PRN Lou Claretta HERO, MD       vancomycin  (VANCOREADY) IVPB 750 mg/150 mL  750 mg Intravenous Q12H Merrill, Kristin A, [facility]   Stopped at 06/03/24 9579     Abtx:  Anti-infectives (From admission, onward)    Start     Dose/Rate Route Frequency Ordered Stop   06/02/24 1600  vancomycin  (VANCOREADY) IVPB 750 mg/150 mL        750 mg 150 mL/hr over 60 Minutes Intravenous Every 12 hours 06/02/24 1006     06/01/24 1930  vancomycin  (VANCOREADY) IVPB 1250 mg/250 mL  Status:  Discontinued        1,250 mg 166.7 mL/hr over 90 Minutes Intravenous Every 24 hours 05/31/24  2316 06/01/24 0907   06/01/24 1800  vancomycin  (VANCOREADY) IVPB 1500 mg/300 mL  Status:  Discontinued        1,500 mg 150 mL/hr over  120 Minutes Intravenous Every 24 hours 06/01/24 0907 06/02/24 1113   05/31/24 2245  vancomycin  (VANCOREADY) IVPB 1500 mg/300 mL        1,500 mg 150 mL/hr over 120 Minutes Intravenous  Once 05/31/24 2147 06/01/24 0218   05/31/24 2145  metroNIDAZOLE  (FLAGYL ) IVPB 500 mg        500 mg 100 mL/hr over 60 Minutes Intravenous Every 12 hours 05/31/24 2053     05/31/24 2145  cefTRIAXone  (ROCEPHIN ) 2 g in sodium chloride  0.9 % 100 mL IVPB        2 g 200 mL/hr over 30 Minutes Intravenous Every 24 hours 05/31/24 2053     05/31/24 1945  vancomycin  (VANCOCIN ) IVPB 1000 mg/200 mL premix        1,000 mg 200 mL/hr over 60 Minutes Intravenous  Once 05/31/24 1930 05/31/24 2039       REVIEW OF SYSTEMS:  Const: negative fever, negative chills, negative weight loss Eyes: negative diplopia or visual changes, negative eye pain ENT: negative coryza, negative sore throat Resp: negative cough, hemoptysis, dyspnea Cards: negative for chest pain, palpitations, lower extremity edema GU: negative for frequency, dysuria and hematuria GI: Negative for abdominal pain, diarrhea, bleeding, constipation Skin: negative for rash and pruritus Heme: negative for easy bruising and gum/nose bleeding MS: as above Neurolo:negative for headaches, dizziness, vertigo, memory problems  Psych: negative for feelings of anxiety, depression  Endocrine:  diabetes Allergy/Immunology- negative for any medication or food allergies ?  Objective:  VITALS:  BP 136/89 (BP Location: Left Arm)   Pulse 98   Temp 98.1 F (36.7 C)   Resp 18   Ht 5' 5 (1.651 m)   Wt 117.9 kg   SpO2 100%   BMI 43.27 kg/m   PHYSICAL EXAM:  General: Alert, cooperative, no distress, appears stated age.  Head: Normocephalic, without obvious abnormality, atraumatic. Eyes: Conjunctivae clear, anicteric sclerae. Pupils  are equal ENT Nares normal. No drainage or sinus tenderness. Lips, mucosa, and tongue normal. No Thrush Neck: Supple, symmetrical, no adenopathy, thyroid: non tender no carotid bruit and no JVD. Back: No CVA tenderness. Lungs: Clear to auscultation bilaterally. No Wheezing or Rhonchi. No rales. Heart: Regular rate and rhythm, no murmur, rub or gallop. Abdomen: Soft, non-tender,not distended. Bowel sounds normal. No masses Extremities: Left great toe ulcer on the tip Hammertoes with chronic callus      Skin: No rashes or lesions. Or bruising Lymph: Cervical, supraclavicular normal. Neurologic: Grossly non-focal Pertinent Labs Lab Results CBC    Component Value Date/Time   WBC 7.3 06/03/2024 0320   RBC 4.26 06/03/2024 0320   HGB 10.8 (L) 06/03/2024 0320   HCT 35.4 (L) 06/03/2024 0320   PLT 305 06/03/2024 0320   MCV 83.1 06/03/2024 0320   MCH 25.4 (L) 06/03/2024 0320   MCHC 30.5 06/03/2024 0320   RDW 15.1 06/03/2024 0320   LYMPHSABS 3.5 05/31/2024 1321   MONOABS 0.9 05/31/2024 1321   EOSABS 0.1 05/31/2024 1321   BASOSABS 0.0 05/31/2024 1321       Latest Ref Rng & Units 06/03/2024    3:20 AM 06/02/2024    4:37 AM 06/01/2024    5:18 AM  CMP  Glucose 70 - 99 mg/dL 824  777  809   BUN 6 - 20 mg/dL 25  23  24    Creatinine 0.44 - 1.00 mg/dL 8.99  9.08  8.88   Sodium 135 - 145 mmol/L 135  133  136   Potassium 3.5 - 5.1 mmol/L  3.8  3.9  4.1   Chloride 98 - 111 mmol/L 106  103  104   CO2 22 - 32 mmol/L 21  20  23    Calcium 8.9 - 10.3 mg/dL 8.5  8.3  8.3       Microbiology: Recent Results (from the past 240 hours)  WOUND CULTURE     Status: None   Collection Time: 05/24/24 12:23 PM  Result Value Ref Range Status   MICRO NUMBER: [id number]  Final   SPECIMEN QUALITY: Adequate  Final   SOURCE: WOUND  Final   STATUS: FINAL  Final   GRAM STAIN: Gram-positive cocci in chains  Final    Comment: No white blood cells seen No epithelial cells seen Many Gram positive cocci in  clusters Many Gram positive cocci in chains Few Gram negative bacilli   RESULT:   Final    A mix of organisms of questionable significance was recovered on culture and not further identified. (Note: Growth did not detect the presence of S.aureus, beta-hemolytic Streptococci or P.aeruginosa).  Culture, blood (Routine X 2) w Reflex to ID Panel     Status: None (Preliminary result)   Collection Time: 05/31/24 10:13 PM   Specimen: BLOOD  Result Value Ref Range Status   Specimen Description BLOOD BLOOD LEFT HAND  Final   Special Requests   Final    BOTTLES DRAWN AEROBIC AND ANAEROBIC Blood Culture adequate volume   Culture   Final    NO GROWTH 3 DAYS Performed at [facility], [address] [place], [place] [address]    Report Status PENDING  Incomplete  Culture, blood (Routine X 2) w Reflex to ID Panel     Status: None (Preliminary result)   Collection Time: 05/31/24 10:13 PM   Specimen: BLOOD  Result Value Ref Range Status   Specimen Description BLOOD BLOOD RIGHT HAND  Final   Special Requests   Final    Blood Culture results may not be optimal due to an inadequate volume of blood received in culture bottles   Culture   Final    NO GROWTH 3 DAYS Performed at [facility], [address], [place], [place] [address]    Report Status PENDING  Incomplete  Aerobic/Anaerobic Culture w Gram Stain (surgical/deep wound)     Status: None (Preliminary result)   Collection Time: 06/01/24  3:57 PM   Specimen: Wound; Tissue  Result Value Ref Range Status   Specimen Description   Final    WOUND Performed at [facility], [address] [place], [place] [address]    Special Requests   Final    LEFT FOOT Performed at [facility], [address] [place], [place] [address]    Gram Stain   Final    RARE WBC PRESENT, PREDOMINANTLY PMN NO ORGANISMS SEEN    Culture   Final    CULTURE REINCUBATED FOR BETTER GROWTH Performed at [facility],  [address], [place], [place] [address]    Report Status PENDING  Incomplete    IMAGING RESULTS: X-ray foot  I have personally reviewed the films Extensive osteolysis of the distal phalanx of the great toe. Possible pathological fracture   Impression/Recommendation Diabetic foot ulcer of the r right toe with osteomyelitis of the left distal phalanx. Has peripheral neuropathy Recent culture no growth or patient has been on Bactrim  followed by Levaquin  Wants to preserve her toe at all cost and would like to get IV antibiotics  instead of p.o. On IV Vanco and ceftriaxone  and Flagyl  Will do IV daptomycin  and stop vancomycin  and continue ceftriaxone  patient is currently n.p.o. Flagyl  for 4 weeks.  I will evaluate her as  outpatient in 2 weeks.  To assess early transition to oral antibiotics Discussed side effects of both daptomycin  and ceftriaxone  and the labs to monitor weekly while on antibiotics.z  Past history of osteomyelitis of the left great toe was treated with IV antibiotics in 2020.  Diabetes mellitus on insulin      ________________________________________________ Discussed with patient, requesting provider Note:  This document was prepared using Dragon voice recognition software and may include unintentional dictation errors.

## 2024-06-03 NOTE — Progress Notes (Signed)
 Peripherally Inserted Central Catheter Placement  The IV Nurse has discussed with the patient and/or persons authorized to consent for the patient, the purpose of this procedure and the potential benefits and risks involved with this procedure.  The benefits include less needle sticks, lab draws from the catheter, and the patient may be discharged home with the catheter. Risks include, but not limited to, infection, bleeding, blood clot (thrombus formation), and puncture of an artery; nerve damage and irregular heartbeat and possibility to perform a PICC exchange if needed/ordered by physician.  Alternatives to this procedure were also discussed.  Bard Power PICC patient education guide, fact sheet on infection prevention and patient information card has been provided to patient /or left at bedside.    PICC Placement Documentation  PICC Single Lumen 06/03/24 Right Basilic 43 cm 0 cm (Active)  Indication for Insertion or Continuance of Line Home intravenous therapies (PICC only) 06/03/24 1300  Exposed Catheter (cm) 0 cm 06/03/24 1300  Site Assessment Clean, Dry, Intact 06/03/24 1300  Line Status Flushed;Saline locked;Blood return noted 06/03/24 1300  Dressing Type Transparent;Securing device 06/03/24 1300  Dressing Status Antimicrobial disc/dressing in place;Clean, Dry, Intact 06/03/24 1300  Line Care Connections checked and tightened 06/03/24 1300  Line Adjustment (NICU/IV Team Only) No 06/03/24 1300  Dressing Intervention New dressing;Adhesive placed at insertion site (IV team only) 06/03/24 1300  Dressing Change Due 06/10/24 06/03/24 1300       Ethyl Priestly Renee 06/03/2024, 1:10 PM

## 2024-06-03 NOTE — Progress Notes (Signed)
 PHARMACY CONSULT NOTE FOR:  OUTPATIENT  PARENTERAL ANTIBIOTIC THERAPY (OPAT)  Indication: diabetic foot osteomyelitis L first hallux  Regimen:  Daptomycin  700mg  IV q24h Ceftriaxone  2gm IV q24h ORAL antibiotic - metronidazole  500mg  PO BID End date: 06/28/2024  -Labs - Once weekly:  CBC/D, CMP, and CPK  - Labs - Once weekly: ESR and CRP  - Fax weekly lab results  promptly to [phone]  - Call [phone] with any questions or critical values  - Please leave PIC in place until doctor has seen patient or been notified - may need to extend antibiotic course   IV antibiotic discharge orders are pended. To discharging provider:  please sign these orders via discharge navigator,  Select New Orders & click on the button choice - Manage This Unsigned Work.     Thank you for allowing pharmacy to be a part of this patient's care.  Armand Preast, PharmD, BCPS, BCIDP Work Cell: [phone] 06/03/2024 10:36 AM

## 2024-06-03 NOTE — TOC Progression Note (Signed)
 Transition of Care [facility]) - Progression Note    Patient Details  Name: Destiny Luna MRN: [id number] Date of Birth: November 19, 1971  Transition of Care [facility]) CM/SW Contact  [place] L Samiel [place], [place] Phone Number: 06/03/2024, 10:29 AM  Clinical Narrative:     HH and home infusion set up with [facility]. CSW spoke with Holley Herring to set up. Pam was aware of patient and advised that if patient is ready for discharge today she can have services set up.        Expected Discharge Plan and Services                                               Social Determinants of Health (SDOH) Interventions SDOH Screenings   Food Insecurity: No Food Insecurity (06/01/2024)  Housing: Unknown (06/01/2024)  Transportation Needs: No Transportation Needs (06/01/2024)  Utilities: Not At Risk (06/01/2024)  Alcohol Screen: Low Risk  (08/07/2023)  Depression (PHQ2-9): Low Risk  (04/03/2024)  Tobacco Use: Medium Risk (05/31/2024)    Readmission Risk Interventions     No data to display

## 2024-06-03 NOTE — Progress Notes (Addendum)
 Pharmacy Antibiotic Note  Destiny Luna is a 53 y.o. female admitted on 05/31/2024 with left foot ulcer/ osteomyelitis of L hallux.  Pharmacy has been consulted for daptomycin  dosing.  -per H&P: sees an Infectious Disease MD in [place]. hx OM in multiple phalanges of both feet , no infections fo rthe last 5 yrs  -evaluated by her podiatrist on June 27, started on Bactrim . Went back to work 7/30. A few days later, she started noticing some tightness and pain in her foot so she followed up with her PCP 7/3. She was started on Levaquin  and referred to outpt ID. Returned to ED 7/4 -also on Ceftriaxone  and Metronidazole  - No plans for amputation per patient request  Today, 06/03/2024 Day #3 vancomycin , ceftriaxone , metronidazole  Renal: SCr WNL Afeb WBC WNL 7/7 CK (baseline) = 30 No statin therapy 6/27 wound cx: GPC chains on gram stain but culture with a mix of organisms of questionable significance 7/5 L foot cx (collected at bedside by podiatry - down to bone): no organism on gram stain 7/4 Blood cx: NGTD  Plan: Daptomycin  700mg  IV q24h started at 1400 Added CK to labs  Ceftriaxone  2gm IV q24h + metronidazole  - change to PO  Follow renal function Follow wound culture OPAT completed   Height: 5' 5 (165.1 cm) Weight: 117.9 kg (260 lb) IBW/kg (Calculated) : 57  Temp (24hrs), Avg:98.3 F (36.8 C), Min:98.1 F (36.7 C), Max:98.5 F (36.9 C)  Recent Labs  Lab 05/30/24 1530 05/31/24 1321 05/31/24 1641 05/31/24 2054 06/01/24 0518 06/02/24 0437 06/03/24 0320  WBC 9.7 9.5  --   --  7.3 7.8 7.3  CREATININE  --  1.26*  --   --  1.11* 0.91 1.00  LATICACIDVEN  --   --  2.1* 1.3  --   --   --     Estimated Creatinine Clearance: 84.6 mL/min (by C-G formula based on SCr of 1 mg/dL).    No Known Allergies  Antimicrobials this admission: Vancomycin  7/4  (evening)>> 7/7 Daptomycin  7/7>> Ceftriaxone  7/4  (evening)>>  Metronidazole  7/4  (evening)>>  Dose adjustments this  admission: 7/6 vanc 1500mg  q24h to 750 mg q12h  Thank you for allowing pharmacy to be a part of this patient's care.  Auriel Kist, PharmD, BCPS, BCIDP Work Cell: [phone] 06/03/2024 10:15 AM

## 2024-06-03 NOTE — Treatment Plan (Signed)
 Diagnosis: Osteomyelitis left great toe with diabetic ulcer Baseline Creatinine <1  Culture Result: NG  No Known Allergies  OPAT Orders Discharge antibiotics: Daptomycin  700mg  IV every 24 hrs  And ceftriaxone  2 grams Iv every 24 hours + flagyl  500mg  PO BID   End date- 06/28/24 May extend for another 2 weeks   [facility] Per Protocol:  Labs weekly while on IV antibiotics: _X_ CBC with differential _X_ CK _X_ CMP _X_ CRP _X_ ESR    _X_ Please leave PIC in place until doctor has seen patient or been notified  Fax weekly lab results  promptly to [phone]  Clinic Follow Up Appt: 06/18/24 at 9.30 am with Dr.Kadience Macchi   Call [phone] with any questions or critical values

## 2024-06-04 ENCOUNTER — Telehealth: Payer: Self-pay

## 2024-06-04 DIAGNOSIS — Z0271 Encounter for disability determination: Secondary | ICD-10-CM

## 2024-06-04 NOTE — Transitions of Care (Post Inpatient/ED Visit) (Signed)
 06/04/2024  Name: Destiny Luna MRN: [id number] DOB: 1971-08-24  Today's TOC FU Call Status: Today's TOC FU Call Status:: Successful TOC FU Call Completed TOC FU Call Complete Date: 06/04/24 Patient's Name and Date of Birth confirmed.  Transition Care Management Follow-up Telephone Call Date of Discharge: 06/03/24 Discharge Facility: [facility]) Type of Discharge: Inpatient Admission How have you been since you were released from the hospital?: Better Any questions or concerns?: No  Items Reviewed: Did you receive and understand the discharge instructions provided?: Yes Medications obtained,verified, and reconciled?: Yes (Medications Reviewed) Any new allergies since your discharge?: No Dietary orders reviewed?: Yes Do you have support at home?: No  Medications Reviewed Today: Medications Reviewed Today     Reviewed by Emmitt Pan, LPN (Licensed Practical Nurse) on 06/04/24 at 0901  Med List Status: <None>   Medication Order Taking? Sig Documenting Provider Last Dose Status Informant  cefTRIAXone  (ROCEPHIN ) IVPB [id number] Yes Inject 2 g into the vein daily for 24 days. Indication:  diabetic foot osteomyelitis L first hallux First Dose: Yes Last Day of Therapy:  06/28/2024 Labs - Once weekly:  CBC/D, CMP, and CPK Labs - Once weekly: ESR and CRP Fax weekly lab results  promptly to [phone] Method of administration: IV Push Method of administration may be changed at the discretion of home infusion pharmacist based upon assessment of the patient and/or caregiver's ability to self-administer the medication ordered. Call [phone] with any questions or critical values Please leave PIC in place until doctor has seen patient or been notified Wouk, Devaughn Sayres, MD  Active   daptomycin  (CUBICIN ) IVPB [id number] Yes Inject 700 mg into the vein daily for 24 days. Indication:  diabetic foot osteomyelitis L first hallux First Dose: Yes Last Day  of Therapy:  06/28/2024 Labs - Once weekly:  CBC/D, CMP, and CPK Labs - Once weekly: ESR and CRP Fax weekly lab results  promptly to [phone] Method of administration: IV Push Method of administration may be changed at the discretion of home infusion pharmacist based upon assessment of the patient and/or caregiver's ability to self-administer the medication ordered. Call [phone] with any questions or critical values Please leave PIC in place until doctor has seen patient or been notified Kandis Devaughn Sayres, MD  Active     Discontinued 09/26/20 1517   gentamicin  cream (GARAMYCIN ) 0.1 % [id number] Yes Apply 1 Application topically 2 (two) times daily. Janit Thresa HERO, DPM  Active Self  insulin  degludec (TRESIBA  FLEXTOUCH) 100 UNIT/ML FlexTouch Pen [id number] Yes Inject 60 Units into the skin daily. Gareth Mliss FALCON, FNP  Active Self  Insulin  Pen Needle 32G X 6 MM MISC [id number] Yes 1 each by Does not apply route daily. Pender, Julie F, FNP  Active Self  ketorolac  (ACULAR ) 0.5 % ophthalmic solution [id number] Yes Place 1 drop into the right eye 4 (four) times daily. [provider]  Active Self    Discontinued 09/26/20 1517   methocarbamol  (ROBAXIN ) 500 MG tablet [id number]  Take 1 tablet (500 mg total) by mouth 2 (two) times daily.  Patient not taking: Reported on 06/04/2024   Brimage, Vondra, DO  Active Self  metroNIDAZOLE  (FLAGYL ) 500 MG tablet [id number] Yes Take 1 tablet (500 mg total) by mouth every 12 (twelve) hours. Wouk, Devaughn Sayres, MD  Active   naproxen  (NAPROSYN ) 500 MG tablet [id number] Yes Take 1 tablet (500 mg total) by mouth 2 (two) times daily with a meal. Kriste Berth, DO  Active Self  prednisoLONE  acetate (PRED FORTE ) 1 % ophthalmic suspension [id number] Yes Place 1 drop into the right eye 4 (four) times daily. [provider]  Active Self  tirzepatide  (MOUNJARO ) 10 MG/0.5ML Pen [id number] Yes Inject 10 mg into the skin once a week. Bernardo Fend, DO   Active Self            Home Care and Equipment/Supplies: Were Home Health Services Ordered?: Yes Name of [facility] Agency[facility] Has Agency set up a time to come to your home?: Yes First Home Health Visit Date: 06/04/24 Any new equipment or medical supplies ordered?: NA  Functional Questionnaire: Do you need assistance with bathing/showering or dressing?: Yes Do you need assistance with meal preparation?: No Do you need assistance with eating?: No Do you have difficulty maintaining continence: No Do you need assistance with getting out of bed/getting out of a chair/moving?: No Do you have difficulty managing or taking your medications?: No  Follow up appointments reviewed: PCP Follow-up appointment confirmed?: Yes Date of PCP follow-up appointment?: 06/19/24 Follow-up Provider: [facility] Follow-up appointment confirmed?: Yes Date of Specialist follow-up appointment?: 06/18/24 Follow-Up Specialty Provider:: ID Do you need transportation to your follow-up appointment?: No Do you understand care options if your condition(s) worsen?: Yes-patient verbalized understanding    SIGNATURE Julian Lemmings, LPN [facility] Nurse Health Advisor Direct Dial [phone]

## 2024-06-04 NOTE — Telephone Encounter (Signed)
 Recd mess from pt via MyChart. She is in the hospital and we recd forms from [facility] [place]. Confirming continuous time with pt starting 05/30/24. See prev notes

## 2024-06-05 DIAGNOSIS — Z0271 Encounter for disability determination: Secondary | ICD-10-CM

## 2024-06-05 LAB — CULTURE, BLOOD (ROUTINE X 2)
Culture: NO GROWTH
Culture: NO GROWTH
Special Requests: ADEQUATE

## 2024-06-05 NOTE — Telephone Encounter (Signed)
 Adv pt via MyChart forms were faxed to [facility] [place] on yesterday.

## 2024-06-06 LAB — AEROBIC/ANAEROBIC CULTURE W GRAM STAIN (SURGICAL/DEEP WOUND): Culture: NO GROWTH

## 2024-06-14 ENCOUNTER — Ambulatory Visit: Admitting: Podiatry

## 2024-06-14 ENCOUNTER — Encounter: Payer: Self-pay | Admitting: Podiatry

## 2024-06-14 VITALS — Ht 65.0 in | Wt 260.0 lb

## 2024-06-14 DIAGNOSIS — E08621 Diabetes mellitus due to underlying condition with foot ulcer: Secondary | ICD-10-CM | POA: Diagnosis not present

## 2024-06-14 DIAGNOSIS — M869 Osteomyelitis, unspecified: Secondary | ICD-10-CM

## 2024-06-14 DIAGNOSIS — L97522 Non-pressure chronic ulcer of other part of left foot with fat layer exposed: Secondary | ICD-10-CM | POA: Diagnosis not present

## 2024-06-14 NOTE — Progress Notes (Addendum)
 Chief Complaint  Patient presents with   Wound Check    Pt is here to f/u on left toe due to wound, she states that she was recently in hospital due to infection of the toe, she states that still some pain to the toe, she makes sure to keep it cleaned and dry, no drainage at this time.    Subjective:  53 y.o. female with PMHx of diabetes mellitus, last A1c 04/03/2024 was 7.4, presenting today for follow-up evaluation of ulcer to the left great toe.  Recently admitted over July 4 weekend.  Refused amputation.  Infectious disease was consulted and she is currently on IV antibiotics in combination with Flagyl  500 mg PO twice daily.  Manage currently with Dr. Fayette   Past Medical History:  Diagnosis Date   Diabetes mellitus without complication [facility])     Past Surgical History:  Procedure Laterality Date   CESAREAN SECTION     HERNIA REPAIR      No Known Allergies   LT great toes 05/31/2024    LT foot 06/14/2024  LT great toe 06/14/2024  Objective/Physical Exam General: The patient is alert and oriented x3 in no acute distress.  Dermatology:  Significant improvement of the ulcer to the distal tip of the left great toe.  Currently measures 1.0 x 1.2 x 0.2 cm.  Granular wound base.  Vascular: Palpable pedal pulses bilaterally. No edema or erythema noted. Capillary refill within normal limits.  Neurological: Light touch and protective threshold diminished bilaterally.   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  No prior amputations.  Patient ambulatory.  Persistent edema to the left great toe  Radiographic exam LT foot 05/24/2024: No osseous erosions noted.  Overall the foot is stable.  No significant change since last x-rays taken  DG Foot 2 Views Left 05/31/2024 IMPRESSION: 1. New extensive osteolysis and probable pathologic fracture involving the distal aspect of the left 1st distal phalanx, consistent with  osteomyelitis. 2. Stable chronic erosive changes involving the distal 2nd and 3rd phalanges. 3. Stable chronic degenerative changes in the midfoot and 1st metatarsophalangeal joint.  Assessment: 1.  Ulcer left fifth toe; currently resolved 2. diabetes mellitus w/ peripheral neuropathy 3.  Ulcer distal to the left great toe 4.  Osteomyelitis left great toe  Plan of Care:  -Patient was evaluated.  -Continue postop shoe at home WBAT -Antibiotics as per ID Dr. Fayette greatly appreciated.  OPAT Orders Discharge antibiotics: Daptomycin  700mg  IV every 24 hrs And ceftriaxone  2 grams Iv every 24 hours + flagyl  500mg  PO BID. End date- 06/28/24 May extend for another 2 weeks -Has gentamicin  cream at home.  Continue with dressing changes daily -Return to clinic at the end of IV antibiotics for follow-up x-rays -Today we discussed in detail amputation versus limb salvage.  For now we will go ahead and continue to observe while the patient is on IV antibiotics.  Most importantly we will see how her toe responds after completion of the antibiotics.  If she has recurrent infection I do recommend amputation.  She is very understanding today.  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 Thresa EMERSON Sar, DPM    [address]  [place], [place] [address]                Office [phone]  Fax [phone]

## 2024-06-18 ENCOUNTER — Encounter: Payer: Self-pay | Admitting: Infectious Diseases

## 2024-06-18 ENCOUNTER — Other Ambulatory Visit
Admission: RE | Admit: 2024-06-18 | Discharge: 2024-06-18 | Disposition: A | Discharge status: Home / Self Care | Source: Ambulatory Visit | Attending: Infectious Diseases | Admitting: Infectious Diseases

## 2024-06-18 ENCOUNTER — Ambulatory Visit: Attending: Infectious Diseases | Admitting: Infectious Diseases

## 2024-06-18 VITALS — BP 124/83 | HR 101 | Temp 97.8°F | Ht 66.0 in | Wt 271.0 lb

## 2024-06-18 DIAGNOSIS — E1142 Type 2 diabetes mellitus with diabetic polyneuropathy: Secondary | ICD-10-CM | POA: Insufficient documentation

## 2024-06-18 DIAGNOSIS — M84478A Pathological fracture, left toe(s), initial encounter for fracture: Secondary | ICD-10-CM

## 2024-06-18 DIAGNOSIS — Z794 Long term (current) use of insulin: Secondary | ICD-10-CM | POA: Diagnosis not present

## 2024-06-18 DIAGNOSIS — G629 Polyneuropathy, unspecified: Secondary | ICD-10-CM

## 2024-06-18 DIAGNOSIS — M86172 Other acute osteomyelitis, left ankle and foot: Secondary | ICD-10-CM

## 2024-06-18 DIAGNOSIS — E11621 Type 2 diabetes mellitus with foot ulcer: Secondary | ICD-10-CM | POA: Diagnosis present

## 2024-06-18 DIAGNOSIS — E1169 Type 2 diabetes mellitus with other specified complication: Secondary | ICD-10-CM | POA: Diagnosis not present

## 2024-06-18 DIAGNOSIS — M869 Osteomyelitis, unspecified: Secondary | ICD-10-CM | POA: Insufficient documentation

## 2024-06-18 LAB — CBC WITH DIFFERENTIAL/PLATELET
Abs Immature Granulocytes: 0.02 K/uL (ref 0.00–0.07)
Basophils Absolute: 0.1 K/uL (ref 0.0–0.1)
Basophils Relative: 1 %
Eosinophils Absolute: 0.2 K/uL (ref 0.0–0.5)
Eosinophils Relative: 2 %
HCT: 38.7 % (ref 36.0–46.0)
Hemoglobin: 12.2 g/dL (ref 12.0–15.0)
Immature Granulocytes: 0 %
Lymphocytes Relative: 46 %
Lymphs Abs: 3.3 K/uL (ref 0.7–4.0)
MCH: 26.1 pg (ref 26.0–34.0)
MCHC: 31.5 g/dL (ref 30.0–36.0)
MCV: 82.7 fL (ref 80.0–100.0)
Monocytes Absolute: 0.7 K/uL (ref 0.1–1.0)
Monocytes Relative: 9 %
Neutro Abs: 3 K/uL (ref 1.7–7.7)
Neutrophils Relative %: 42 %
Platelets: 310 K/uL (ref 150–400)
RBC: 4.68 MIL/uL (ref 3.87–5.11)
RDW: 14.9 % (ref 11.5–15.5)
WBC: 7.2 K/uL (ref 4.0–10.5)
nRBC: 0 % (ref 0.0–0.2)

## 2024-06-18 LAB — C-REACTIVE PROTEIN: CRP: 1.7 mg/dL — ABNORMAL HIGH (ref <1.0)

## 2024-06-18 LAB — COMPREHENSIVE METABOLIC PANEL WITH GFR
ALT: 22 U/L (ref 0–44)
AST: 22 U/L (ref 15–41)
Albumin: 3.4 g/dL — ABNORMAL LOW (ref 3.5–5.0)
Alkaline Phosphatase: 91 U/L (ref 38–126)
Anion gap: 12 (ref 5–15)
BUN: 22 mg/dL — ABNORMAL HIGH (ref 6–20)
CO2: 26 mmol/L (ref 22–32)
Calcium: 8.8 mg/dL — ABNORMAL LOW (ref 8.9–10.3)
Chloride: 99 mmol/L (ref 98–111)
Creatinine, Ser: 1 mg/dL (ref 0.44–1.00)
GFR, Estimated: >60 mL/min (ref >60)
Glucose, Bld: 191 mg/dL — ABNORMAL HIGH (ref 70–99)
Potassium: 4 mmol/L (ref 3.5–5.1)
Sodium: 137 mmol/L (ref 135–145)
Total Bilirubin: 0.5 mg/dL (ref 0.0–1.2)
Total Protein: 7.4 g/dL (ref 6.5–8.1)

## 2024-06-18 LAB — SEDIMENTATION RATE: Sed Rate: 52 mm/h — ABNORMAL HIGH (ref 0–30)

## 2024-06-18 LAB — CK: Total CK: 122 U/L (ref 38–234)

## 2024-06-18 NOTE — Patient Instructions (Addendum)
 You are here for follow up of the rt great toe infection- you are on dapto and ceftriaxone - continue both. Follow up 2 weeks

## 2024-06-18 NOTE — Progress Notes (Signed)
 NAME: Destiny Luna  DOB: 12/04/1970  MRN: [id number]  Date/Time: 06/18/2024 9:06 PM   Subjective:   ?follow up visit Destiny Luna is a 53 y.o. with a history  of DM, peripheral neuropathy   Admitted 7/4-06/03/24 at [facility] for left great toe infection.Xray showed new  extensive osteolysis and probable pathologic fracture involving the distal aspect of the left 1st distal phalanx,consistent with osteomyelitis. 2. Stable chronic erosive changes involving the distal 2nd and 3rd phalanges.. pt did not want any surgical intervention like amputation . She had taken doxy and then levaquin  before presenting to the hospital. Culture done in the hopital on 7/5 showed enterococcus fecalis and strep anginosis. She was discharged on IV dapto and ceftriaxone . She initially took the IV in the morning and had Diarrhea and GI bleed which  resolved since she changed the time to the evening. Doing better Saw Dr.Evans as OP and he is pleased with her progress  Recent sed rate 90 and crp 40  Past Medical History:  Diagnosis Date   Diabetes mellitus without complication (HCC)     Past Surgical History:  Procedure Laterality Date   CESAREAN SECTION     HERNIA REPAIR      Social History   Socioeconomic History   Marital status: Single    Spouse name: Not on file   Number of children: 1   Years of education: Not on file   Highest education level: Bachelor's degree (e.g., BA, AB, BS)  Occupational History   Not on file  Tobacco Use   Smoking status: Former    Current packs/day: 0.25    Average packs/day: 0.3 packs/day for 4.0 years (1.0 ttl pk-yrs)    Types: Cigarettes   Smokeless tobacco: Never   Tobacco comments:    quit for 8 years and restarted  Vaping Use   Vaping status: Never Used  Substance and Sexual Activity   Alcohol use: Not Currently    Comment: rarely   Drug use: Never   Sexual activity: Not Currently  Other Topics Concern   Not on file  Social History Narrative   Not on  file   Social Drivers of Health   Financial Resource Strain: Not on file  Food Insecurity: No Food Insecurity (06/01/2024)   Hunger Vital Sign    Worried About Running Out of Food in the Last Year: Never true    Ran Out of Food in the Last Year: Never true  Transportation Needs: No Transportation Needs (06/01/2024)   PRAPARE - Administrator, Civil Service (Medical): No    Lack of Transportation (Non-Medical): No  Physical Activity: Not on file  Stress: Not on file  Social Connections: Not on file  Intimate Partner Violence: Not At Risk (06/01/2024)   Humiliation, Afraid, Rape, and Kick questionnaire    Fear of Current or Ex-Partner: No    Emotionally Abused: No    Physically Abused: No    Sexually Abused: No    Family History  Problem Relation Age of Onset   Diabetes Mother    CAD Father        CABG x 3   Stroke Father    Diabetes Father    No Known Allergies I? Current Outpatient Medications  Medication Sig Dispense Refill   cefTRIAXone  (ROCEPHIN ) IVPB Inject 2 g into the vein daily for 24 days. Indication:  diabetic foot osteomyelitis L first hallux First Dose: Yes Last Day of Therapy:  06/28/2024 Labs - Once weekly:  CBC/D,  CMP, and CPK Labs - Once weekly: ESR and CRP Fax weekly lab results  promptly to [phone] Method of administration: IV Push Method of administration may be changed at the discretion of home infusion pharmacist based upon assessment of the patient and/or caregiver's ability to self-administer the medication ordered. Call [phone] with any questions or critical values Please leave PIC in place until doctor has seen patient or been notified 25 Units 0   daptomycin  (CUBICIN ) IVPB Inject 700 mg into the vein daily for 24 days. Indication:  diabetic foot osteomyelitis L first hallux First Dose: Yes Last Day of Therapy:  06/28/2024 Labs - Once weekly:  CBC/D, CMP, and CPK Labs - Once weekly: ESR and CRP Fax weekly lab results  promptly to  [phone] Method of administration: IV Push Method of administration may be changed at the discretion of home infusion pharmacist based upon assessment of the patient and/or caregiver's ability to self-administer the medication ordered. Call [phone] with any questions or critical values Please leave PIC in place until doctor has seen patient or been notified 25 Units 0   gentamicin  cream (GARAMYCIN ) 0.1 % Apply 1 Application topically 2 (two) times daily. 30 g 1   insulin  degludec (TRESIBA  FLEXTOUCH) 100 UNIT/ML FlexTouch Pen Inject 60 Units into the skin daily. 9 mL 5   Insulin  Pen Needle 32G X 6 MM MISC 1 each by Does not apply route daily. 50 each 3   ketorolac  (ACULAR ) 0.5 % ophthalmic solution Place 1 drop into the right eye 4 (four) times daily.     methocarbamol  (ROBAXIN ) 500 MG tablet Take 1 tablet (500 mg total) by mouth 2 (two) times daily. 20 tablet 0   metroNIDAZOLE  (FLAGYL ) 500 MG tablet Take 1 tablet (500 mg total) by mouth every 12 (twelve) hours. 78 tablet 0   naproxen  (NAPROSYN ) 500 MG tablet Take 1 tablet (500 mg total) by mouth 2 (two) times daily with a meal. 30 tablet 0   prednisoLONE  acetate (PRED FORTE ) 1 % ophthalmic suspension Place 1 drop into the right eye 4 (four) times daily.     tirzepatide  (MOUNJARO ) 10 MG/0.5ML Pen Inject 10 mg into the skin once a week. 2 mL 0   No current facility-administered medications for this visit.     Abtx:  Anti-infectives (From admission, onward)    None       REVIEW OF SYSTEMS:  Const: negative fever, negative chills, negative weight loss Eyes: negative diplopia or visual changes, negative eye pain ENT: negative coryza, negative sore throat Resp: negative cough, hemoptysis, dyspnea Cards: negative for chest pain, palpitations, lower extremity edema GU: negative for frequency, dysuria and hematuria GI: Negative for abdominal pain, diarrhea, bleeding, constipation Skin: negative for rash and pruritus Heme:  negative for easy bruising and gum/nose bleeding MS: negative for myalgias, arthralgias, back pain and muscle weakness Neurolo:negative for headaches, dizziness, vertigo, memory problems  Psych: anxiety,  Endocrine: , diabetes Allergy/Immunology- negative for any medication or food allergies ?  Objective:  VITALS:  BP 124/83   Pulse (!) 101   Temp 97.8 F (36.6 C) (Temporal)   Ht 5' 6 (1.676 m)   Wt 271 lb (122.9 kg)   SpO2 98%   BMI 43.74 kg/m  RT PICC PHYSICAL EXAM:  General: Alert, cooperative, no distress, appears stated age.  Head: Normocephalic, without obvious abnormality, atraumatic. Eyes: Conjunctivae clear, anicteric sclerae. Pupils are equal ENT Nares normal. No drainage or sinus tenderness. Lips, mucosa, and tongue normal. No Thrush  Neck: Supple, symmetrical, no adenopathy, thyroid: non tender no carotid bruit and no JVD. Lungs: Clear to auscultation bilaterally. No Wheezing or Rhonchi. No rales. Heart: Regular rate and rhythm, no murmur, rub or gallop. Abdomen: did not examine Extremities: left great toe ulcer much smaller 06/18/24  06/01/24   Skin: No rashes or lesions. Or bruising Lymph: Cervical, supraclavicular normal. Neurologic: Grossly non-focal Pertinent Labs Lab Results CBC    Component Value Date/Time   WBC 7.2 06/18/2024 1039   RBC 4.68 06/18/2024 1039   HGB 12.2 06/18/2024 1039   HCT 38.7 06/18/2024 1039   PLT 310 06/18/2024 1039   MCV 82.7 06/18/2024 1039   MCH 26.1 06/18/2024 1039   MCHC 31.5 06/18/2024 1039   RDW 14.9 06/18/2024 1039   LYMPHSABS 3.3 06/18/2024 1039   MONOABS 0.7 06/18/2024 1039   EOSABS 0.2 06/18/2024 1039   BASOSABS 0.1 06/18/2024 1039       Latest Ref Rng & Units 06/18/2024   10:39 AM 06/03/2024    3:20 AM 06/02/2024    4:37 AM  CMP  Glucose 70 - 99 mg/dL 808  824  777   BUN 6 - 20 mg/dL 22  25  23    Creatinine 0.44 - 1.00 mg/dL 8.99  8.99  9.08   Sodium 135 - 145 mmol/L 137  135  133   Potassium 3.5 - 5.1  mmol/L 4.0  3.8  3.9   Chloride 98 - 111 mmol/L 99  106  103   CO2 22 - 32 mmol/L 26  21  20    Calcium 8.9 - 10.3 mg/dL 8.8  8.5  8.3   Total Protein 6.5 - 8.1 g/dL 7.4     Total Bilirubin 0.0 - 1.2 mg/dL 0.5     Alkaline Phos 38 - 126 U/L 91     AST 15 - 41 U/L 22     ALT 0 - 44 U/L 22       ? Impression/Recommendation ?Diabetic foot ulcer of the left great toe with osteomyelitis of the left distal phalanx with pathological fracture. Has peripheral neuropathy On daptomycin  and ceftriaxone  -initial plan was to treat for 4 weeks but she is concerned that PO may not work as good- She wants all measures taken to prevent an amputation So will treat for the whole 6 weeks until 07/12/24 and then would switch to PO Her last ESR was 90 and she is concerned that it has increased from 78. Will repeat labs here today   Past history of osteomyelitis of the left great toe was treated with IV antibiotics in 2020.   Diabetes mellitus on insulin    ? ________________________________________________ Discussed with patient, follow up 2 weeks

## 2024-06-19 ENCOUNTER — Ambulatory Visit: Admitting: Nurse Practitioner

## 2024-06-19 ENCOUNTER — Telehealth: Payer: Self-pay

## 2024-06-19 ENCOUNTER — Telehealth: Payer: Self-pay | Admitting: Infectious Diseases

## 2024-06-19 ENCOUNTER — Other Ambulatory Visit: Payer: Self-pay | Admitting: Internal Medicine

## 2024-06-19 ENCOUNTER — Ambulatory Visit: Payer: Self-pay

## 2024-06-19 ENCOUNTER — Encounter: Payer: Self-pay | Admitting: Nurse Practitioner

## 2024-06-19 VITALS — BP 118/74 | HR 90 | Resp 16 | Ht 66.0 in | Wt 269.0 lb

## 2024-06-19 DIAGNOSIS — E11621 Type 2 diabetes mellitus with foot ulcer: Secondary | ICD-10-CM

## 2024-06-19 DIAGNOSIS — M869 Osteomyelitis, unspecified: Secondary | ICD-10-CM

## 2024-06-19 DIAGNOSIS — Z09 Encounter for follow-up examination after completed treatment for conditions other than malignant neoplasm: Secondary | ICD-10-CM

## 2024-06-19 DIAGNOSIS — L97509 Non-pressure chronic ulcer of other part of unspecified foot with unspecified severity: Secondary | ICD-10-CM

## 2024-06-19 DIAGNOSIS — L97526 Non-pressure chronic ulcer of other part of left foot with bone involvement without evidence of necrosis: Secondary | ICD-10-CM

## 2024-06-19 MED ORDER — TIRZEPATIDE 12.5 MG/0.5ML ~~LOC~~ SOAJ: 12.5000 mg | SUBCUTANEOUS | 1 refills | Status: DC

## 2024-06-19 NOTE — Patient Instructions (Signed)
 Healthy Weight Loss Guide ?? Weight Loss Goal - Aim for 1-2 pounds per week - Target: 5-10% of your starting body weight over 3-6 months ??? Nutrition Tips - Eat 3 meals per day and avoid skipping meals - Fill half your plate with vegetables, a quarter with protein, a quarter with whole grains - Choose lean proteins: chicken, fish, eggs, tofu, beans - Limit: - Sugary drinks (soda, sweet tea, juice) - Fried foods and fast food - Processed snacks (chips, candy, cookies) - Drink at least 64 oz of water per day - Practice portion control and mindful eating ???? Lifestyle Habits - Track what you eat (apps like MyFitnessPal, Lose It!, or a paper log) - Get 7-9 hours of sleep per night - Manage stress (meditation, breathing exercises, counseling if needed) - Limit alcohol (empty calories and may increase hunger) ???? Exercise Recommendations - Goal: 150 minutes per week of moderate activity (e.g., brisk walking, cycling) - Start with 10-15 minutes/day and build up gradually - Add 2 days per week of strength training (light weights, resistance bands, or bodyweight) ?? Remember: Progress > Perfection Small changes every day add up. Don't give up! - Avoid high-fat or greasy foods to reduce nausea - Focus on protein at each meal to preserve muscle mass - Stay well hydrated (at least 64 oz water per day) - Limit sugar and processed carbohydrates ?? Managing Side Effects if on weight loss medication - Eat slowly and stop eating when you feel full - Use anti-nausea strategies: ginger tea, peppermint, crackers - Talk to your provider about adjusting the dose if needed - Stool softeners or fiber supplements can help with constipation ?? Staying on Track - Track weight and non-scale victories (energy, clothing fit, labs) - Follow up with your provider regularly - Don't stop medication without medical guidance - Combine medication with healthy habits for best results ?? Remember Weight loss  medications are a tool, not a shortcut. Healthy habits matter. Be patient and consistent--small changes lead to big results.

## 2024-06-19 NOTE — Progress Notes (Signed)
 BP 118/74   Pulse 90   Resp 16   Ht 5' 6 (1.676 m)   Wt 269 lb (122 kg)   SpO2 96%   BMI 43.42 kg/m    Subjective:    Patient ID: Destiny Luna, female    DOB: May 28, 1971, 53 y.o.   MRN: [id number]  HPI: Destiny Luna is a 53 y.o. female  Chief Complaint  Patient presents with   Hospitalization Follow-up    Discussed the use of AI scribe software for clinical note transcription with the patient, who gave verbal consent to proceed.  History of Present Illness Destiny Luna is a 53 year old female with osteomyelitis of the left great toe who presents for a hospital follow-up.  Left great toe osteomyelitis - Hospitalized for osteomyelitis of the left great toe, discharged on June 03, 2024 - PICC line placed for intravenous antibiotics - Receiving ceftriaxone  2 grams daily and Cubicin  700 mg IV, both for 24 days - Follow-up with podiatry on July 18 and infectious disease on July 22 - Laboratory results: normal CBC, stable CMP, normal CK, SED rate improved from 76 to 52, CRP improved from 12.1 to 1.7 - Contemplating surgical intervention on toes with Dr. 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, expressing anxiety about recertification and job impact  Renal function and hydration - Concerned about kidney function and potential impact of antibiotics on vitamin levels - Drinks eight 16-ounce bottles of water daily  Occupational concerns - Works as a Financial controller - Considering delaying return to work until fully healed and weight loss goals are achieved - Expresses anxiety about recertification and potential impact on  employment     Current weight: 269 lbs Current BMI: 43.42 Waist Measurement : 48.5 inches     Admit date: 05/31/2024 Discharge date: 06/03/2024   Time spent: 35 minutes   Recommendations for Outpatient Follow-up:  Podiatry f/u 7/18 as scheduled ID f/u 7/22 as scheduled PCP f/u as well        Discharge Diagnoses:  Principal Problem:   Osteomyelitis of great toe of left foot (HCC) Active Problems:   Morbid obesity (HCC)   Diabetes mellitus (HCC)   AKI (acute kidney injury) (HCC)   Diabetic ulcer of toe of left foot associated with type 2 diabetes mellitus, with bone involvement without evidence of necrosis [facility])     Discharge Condition: stable   Diet recommendation: carb modified      Filed Weights    05/31/24 1312  Weight: 117.9 kg      History of present illness:  From admission h and p Destiny Luna is a 53 y.o. female with medical history significant for type 2 diabetes, osteomyelitis, hyperlipidemia, morbid obesity and left foot ulcer presented to the ED for evaluation of worsening left foot ulcer. 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 After wearing the shoe for a few days, she noticed a small bruise on the side of her left big toe that started peeling. She clipped off the peel but a few days later she started noticing swelling around her left big toe and of her foot.  She was evaluated by her podiatrist on June 27, she had an x-ray of the foot, started on Bactrim . She noticed some improvement in her foot so she went back to work on Monday. A few days later, she started noticing some tightness and pain in her foot so she followed up with her PCP yesterday. She was started on Levaquin  and referred to outpatient ID. Today, she noticed worsening of her foot pain with swelling that was progressing up her foot and ankles so she presented to the ED for further evaluation. She denies any fevers, chills,  nausea, abdominal pain, chest pain, dizziness, headache, trauma or falls. States she has a history of osteomyelitis in multiple phalanges of both feet and they have all been managed with antibiotics by her infectious disease doctor in [place]. She has not had any infections for the last 5 years.    Hospital Course:    # Osteomyelitis left first toe History of osteo in her toes, says always managed with antibiotics with her infectious disease specialist in [place]. Here failed outpatient bactrim  and was started on levofloxacin  2 days prior to presentation. Here x-ray shows extensive osteolysis and probable pathologic fracture involving the distal aspect of the left 1st distal phalanx, consistent w/ osteomyelitis. Clinically signs of infection with tenderness and swelling tracking up medial thigh with  left groin lymphadenopathy on u/s (which was negative for DVT). Strong palpable pulses. Cellulitis much improved with antibiotics. Has declined amputation from podiatry. Also declines oral abx. Treated with vanc/ceftriaxone /flagyl  here. Picc placed 7/7. ID consulted, opat orders as below. Has ID and podiatry f/u scheduled.    OPAT Orders Discharge antibiotics: Daptomycin  700mg  IV every 24 hrs   And ceftriaxone  2 grams Iv every 24 hours + flagyl  500mg  PO BID     End date- 06/28/24 May extend for another 2 weeks     [facility] Per Protocol:   Labs weekly while on IV antibiotics: _X_ CBC with differential _X_ CK _X_ CMP _X_ CRP _X_ ESR       _X_ Please leave PIC in place until doctor has seen patient or been notified   Fax weekly lab results  promptly to [phone]   Clinic Follow Up Appt: 06/18/24 at 9.30 am with Dr.Ravishankar     Call [phone] with any questions or critical values   # T2DM History poor control, here A1c 7.5. On degludec 60 units daily, mounjaro    # Recent cataract surgery - home eye drops     Procedures: none    Consultations: Podiatry, ID    Discharge Exam:     Vitals:    06/03/24 0400 06/03/24 0805  BP: 119/60 136/89  Pulse: 93 98  Resp: 16 18  Temp: 98.2 F (36.8 C) 98.1 F (36.7 C)  SpO2: 96% 100%      General exam: Appears calm and comfortable  Respiratory system: Clear to auscultation. Respiratory effort normal. Cardiovascular system: S1 & S2 heard, RRR. No JVD, murmurs, rubs, gallops or clicks. No pedal edema. Gastrointestinal system: Abdomen is nondistended, obese, soft.  Central nervous system: Alert and oriented. No focal neurological deficits. Extremities: mild swelling left leg, resolved medial tenderness, toe is wrapped Skin: ulcer over distal surface of left 1st toe Psychiatry: Judgement and insight appear normal. Mood & affect appropriate.    Discharge Instructions     Discharge Instructions       [facility] pharmacist to adjust dose for Vancomycin , Aminoglycosides and other anti-infective therapies as requested by physician.  Complete by: As directed      [facility] to provide Cath Flo 2mg    Complete by: As directed      Administer for PICC line occlusion and as ordered by physician for other access device issues.    Anaphylaxis Kit: Provided to treat any anaphylactic reaction to the medication being provided to the patient if First Dose or when requested by physician   Complete by: As directed      Epinephrine  1mg /ml vial / amp: Administer 0.3mg  (0.42ml) subcutaneously once for moderate to severe anaphylaxis, nurse to call physician and pharmacy when reaction occurs and call 911 if needed for immediate care    Diphenhydramine  50mg /ml IV vial: Administer 25-50mg  IV/IM PRN for first dose reaction, rash, itching, mild reaction, nurse to call physician and pharmacy when reaction occurs    Sodium Chloride  0.9% NS 500ml IV: Administer if needed for hypovolemic blood pressure drop or as ordered by physician after call to physician with anaphylactic reaction    Change dressing on IV  access line weekly and PRN   Complete by: As directed      Diet - low sodium heart healthy   Complete by: As directed      Discharge wound care:   Complete by: As directed      Betadine dressings    Flush IV access with Sodium Chloride  0.9% and Heparin 10 units/ml or 100 units/ml   Complete by: As directed      Home infusion instructions - [facility]   Complete by: As directed      Instructions: Flush IV access with Sodium Chloride  0.9% and Heparin 10units/ml or 100units/ml    Change dressing on IV access line: Weekly and PRN    Instructions Cath Flo 2mg : Administer for PICC Line occlusion and as ordered by physician for other access device    [facility] pharmacist to adjust dose for: Vancomycin , Aminoglycosides and other anti-infective therapies as requested by physician    Increase activity slowly   Complete by: As directed      Method of administration may be changed at the discretion of home infusion pharmacist based upon assessment of the patient and/or caregiver's ability to self-administer the medication ordered   Complete by: As directed           Allergies as of 06/03/2024   No Known Allergies         Medication List       STOP taking these medications     levofloxacin  750 MG tablet Commonly known as: Levaquin     sulfamethoxazole -trimethoprim  800-160 MG tablet Commonly known as: BACTRIM  DS           TAKE these medications     cefTRIAXone  IVPB Commonly known as: ROCEPHIN  Inject 2 g into the vein daily for 24 days. Indication:  diabetic foot osteomyelitis L first hallux First Dose: Yes Last Day of Therapy:  06/28/2024 Labs - Once weekly:  CBC/D, CMP, and CPK Labs - Once weekly: ESR and CRP Fax weekly lab results  promptly to [phone] Method of administration: IV Push Method of administration may be changed at the discretion of home infusion pharmacist based upon assessment of the patient and/or caregiver's ability to self-administer  the medication ordered. Call [phone] with any questions or critical values Please leave PIC in place until doctor has seen patient or been notified Start taking on: June 04, 2024    daptomycin  IVPB Commonly known as: CUBICIN  Inject 700 mg  into the vein daily for 24 days. Indication:  diabetic foot osteomyelitis L first hallux First Dose: Yes Last Day of Therapy:  06/28/2024 Labs - Once weekly:  CBC/D, CMP, and CPK Labs - Once weekly: ESR and CRP Fax weekly lab results  promptly to [phone] Method of administration: IV Push Method of administration may be changed at the discretion of home infusion pharmacist based upon assessment of the patient and/or caregiver's ability to self-administer the medication ordered. Call [phone] with any questions or critical values Please leave PIC in place until doctor has seen patient or been notified Start taking on: June 04, 2024    gentamicin  cream 0.1 % Commonly known as: GARAMYCIN  Apply 1 Application topically 2 (two) times daily.    Insulin  Pen Needle 32G X 6 MM Misc 1 each by Does not apply route daily.    ketorolac  0.5 % ophthalmic solution Commonly known as: ACULAR  Place 1 drop into the right eye 4 (four) times daily.    methocarbamol  500 MG tablet Commonly known as: ROBAXIN  Take 1 tablet (500 mg total) by mouth 2 (two) times daily.    metroNIDAZOLE  500 MG tablet Commonly known as: FLAGYL  Take 1 tablet (500 mg total) by mouth every 12 (twelve) hours.    Mounjaro  10 MG/0.5ML Pen Generic drug: tirzepatide  Inject 10 mg into the skin once a week.    naproxen  500 MG tablet Commonly known as: NAPROSYN  Take 1 tablet (500 mg total) by mouth 2 (two) times daily with a meal.    prednisoLONE  acetate 1 % ophthalmic suspension Commonly known as: PRED FORTE  Place 1 drop into the right eye 4 (four) times daily.    Tresiba  FlexTouch 100 UNIT/ML FlexTouch Pen Generic drug: insulin  degludec Inject 60 Units into the skin daily.    ID follow up note: Destiny Luna is a 53 y.o. with a history  of DM, peripheral neuropathy   Admitted 7/4-06/03/24 at [facility] for left great toe infection.Xray showed new  extensive osteolysis and probable pathologic fracture involving the distal aspect of the left 1st distal phalanx,consistent with osteomyelitis. 2. Stable chronic erosive changes involving the distal 2nd and 3rd phalanges.. pt did not want any surgical intervention like amputation . She had taken doxy and then levaquin  before presenting to the hospital. Culture done in the hopital on 7/5 showed enterococcus fecalis and strep anginosis. She was discharged on IV dapto and ceftriaxone . She initially took the IV in the morning and had Diarrhea and GI bleed which  resolved since she changed the time to the evening. Doing better Saw Dr.Evans as OP and he is pleased with her progress   Recent sed rate 90 and crp 40     04/03/2024   10:12 AM 08/07/2023   12:59 PM 06/06/2023    9:50 AM  Depression screen PHQ 2/9  Decreased Interest 0 0 0  Down, Depressed, Hopeless 0 0 1  PHQ - 2 Score 0 0 1  Altered sleeping 0    Tired, decreased energy 0    Change in appetite 0    Feeling bad or failure about yourself  0    Trouble concentrating 0    Moving slowly or fidgety/restless 0    Suicidal thoughts 0    PHQ-9 Score 0    Difficult doing work/chores Not difficult at all      Relevant past medical, surgical, family and social history reviewed and updated as indicated. Interim medical history since our last visit reviewed. Allergies and medications  reviewed and updated.  Review of Systems  Ten systems reviewed and is negative except as mentioned in HPI      Objective:     BP 118/74   Pulse 90   Resp 16   Ht 5' 6 (1.676 m)   Wt 269 lb (122 kg)   SpO2 96%   BMI 43.42 kg/m    Wt Readings from Last 3 Encounters:  06/19/24 269 lb (122 kg)  06/18/24 271 lb (122.9 kg)  06/14/24 260 lb (117.9 kg)    Physical Exam Physical  Exam GENERAL: Alert, cooperative, well developed, no acute distress HEENT: Normocephalic, normal oropharynx, moist mucous membranes CHEST: Clear to auscultation bilaterally, No wheezes, rhonchi, or crackles CARDIOVASCULAR: Normal heart rate and rhythm, S1 and S2 normal without murmurs ABDOMEN: Soft, non-tender, non-distended, without organomegaly, Normal bowel sounds EXTREMITIES: No cyanosis or edema, left great toe ulcer NEUROLOGICAL: Cranial nerves grossly intact, Moves all extremities without gross motor or sensory deficit   Results for orders placed or performed during the hospital encounter of 06/18/24  C-reactive protein   Collection Time: 06/18/24 10:39 AM  Result Value Ref Range   CRP 1.7 (H) <1.0 mg/dL  Sedimentation rate   Collection Time: 06/18/24 10:39 AM  Result Value Ref Range   Sed Rate 52 (H) 0 - 30 mm/hr  CBC with Differential/Platelet   Collection Time: 06/18/24 10:39 AM  Result Value Ref Range   WBC 7.2 4.0 - 10.5 K/uL   RBC 4.68 3.87 - 5.11 MIL/uL   Hemoglobin 12.2 12.0 - 15.0 g/dL   HCT 61.2 63.9 - 53.9 %   MCV 82.7 80.0 - 100.0 fL   MCH 26.1 26.0 - 34.0 pg   MCHC 31.5 30.0 - 36.0 g/dL   RDW 85.0 88.4 - 84.4 %   Platelets 310 150 - 400 K/uL   nRBC 0.0 0.0 - 0.2 %   Neutrophils Relative % 42 %   Neutro Abs 3.0 1.7 - 7.7 K/uL   Lymphocytes Relative 46 %   Lymphs Abs 3.3 0.7 - 4.0 K/uL   Monocytes Relative 9 %   Monocytes Absolute 0.7 0.1 - 1.0 K/uL   Eosinophils Relative 2 %   Eosinophils Absolute 0.2 0.0 - 0.5 K/uL   Basophils Relative 1 %   Basophils Absolute 0.1 0.0 - 0.1 K/uL   Immature Granulocytes 0 %   Abs Immature Granulocytes 0.02 0.00 - 0.07 K/uL  Comprehensive metabolic panel with GFR   Collection Time: 06/18/24 10:39 AM  Result Value Ref Range   Sodium 137 135 - 145 mmol/L   Potassium 4.0 3.5 - 5.1 mmol/L   Chloride 99 98 - 111 mmol/L   CO2 26 22 - 32 mmol/L   Glucose, Bld 191 (H) 70 - 99 mg/dL   BUN 22 (H) 6 - 20 mg/dL   Creatinine,  Ser 8.99 0.44 - 1.00 mg/dL   Calcium 8.8 (L) 8.9 - 10.3 mg/dL   Total Protein 7.4 6.5 - 8.1 g/dL   Albumin 3.4 (L) 3.5 - 5.0 g/dL   AST 22 15 - 41 U/L   ALT 22 0 - 44 U/L   Alkaline Phosphatase 91 38 - 126 U/L   Total Bilirubin 0.5 0.0 - 1.2 mg/dL   GFR, Estimated >39 >39 mL/min   Anion gap 12 5 - 15  CK   Collection Time: 06/18/24 10:39 AM  Result Value Ref Range   Total CK 122 38 - 234 U/L  Assessment & Plan:   Problem List Items Addressed This Visit       Endocrine   Diabetes mellitus (HCC)   Relevant Medications   tirzepatide  (MOUNJARO ) 12.5 MG/0.5ML Pen   Other Relevant Orders   Referral to Nutrition and Diabetes Services   Diabetic ulcer of toe of left foot associated with type 2 diabetes mellitus, with bone involvement without evidence of necrosis (HCC)   Relevant Medications   tirzepatide  (MOUNJARO ) 12.5 MG/0.5ML Pen     Musculoskeletal and Integument   Osteomyelitis of great toe of left foot (HCC) - Primary     Other   Morbid obesity (HCC)   Relevant Medications   tirzepatide  (MOUNJARO ) 12.5 MG/0.5ML Pen   Other Relevant Orders   Referral to Nutrition and Diabetes Services   Other Visit Diagnoses       Hospital discharge follow-up            Assessment and Plan Assessment & Plan Osteomyelitis of left great toe, status post hospitalization and ongoing IV antibiotics via PICC line Osteomyelitis of the left great toe, status post hospitalization from July 4 to June 03, 2024. Currently on IV antibiotics via PICC line with ceftriaxone  2 grams daily and Cubicin  700 mg daily for 24 days. Recent lab results show improvement with normal CBC, stable CMP, normal CK, improved SED rate from 76 to 52, and CRP from 12.1 to 1.7. Plan to have surgery on the toe with Dr. Janit around February next year after full recovery from current treatment. - Continue ceftriaxone  2 grams daily for 24 days - Continue Cubicin  700 mg daily for 24 days - Plan surgery on the  toe with Dr. Janit around February next year  Obesity with suboptimal response to GLP-1 agonist therapy Obesity with suboptimal response to Mounjaro , a GLP-1 agonist. She expresses concern about weight and the effectiveness of Mounjaro . Discussion about the variability in response to GLP-1 agonists, with some patients not experiencing weight loss. Current dose is 10 mg, with potential to increase to 12.5 mg. Emphasis on the importance of diet and exercise in conjunction with medication. Discussion about the potential for insurance coverage for a nutritionist consultation. - Increase Mounjaro  to 12.5 mg - Provide information on dietary habits and calorie intake - Encourage strength training and walking - Measure waist circumference - Consider referral to a nutritionist, pending insurance coverage  Healthy Weight Loss Guide ?? Weight Loss Goal - Aim for 1-2 pounds per week - Target: 5-10% of your starting body weight over 3-6 months ??? Nutrition Tips - Eat 3 meals per day and avoid skipping meals - Fill half your plate with vegetables, a quarter with protein, a quarter with whole grains - Choose lean proteins: chicken, fish, eggs, tofu, beans - Limit: - Sugary drinks (soda, sweet tea, juice) - Fried foods and fast food - Processed snacks (chips, candy, cookies) - Drink at least 64 oz of water per day - Practice portion control and mindful eating ???? Lifestyle Habits - Track what you eat (apps like MyFitnessPal, Lose It!, or a paper log) - Get 7-9 hours of sleep per night - Manage stress (meditation, breathing exercises, counseling if needed) - Limit alcohol (empty calories and may increase hunger) ???? Exercise Recommendations - Goal: 150 minutes per week of moderate activity (e.g., brisk walking, cycling) - Start with 10-15 minutes/day and build up gradually - Add 2 days per week of strength training (light weights, resistance bands, or bodyweight) ?? Remember: Progress >  Perfection Small changes  every day add up. Don't give up! - Avoid high-fat or greasy foods to reduce nausea - Focus on protein at each meal to preserve muscle mass - Stay well hydrated (at least 64 oz water per day) - Limit sugar and processed carbohydrates ?? Managing Side Effects if on weight loss medication - Eat slowly and stop eating when you feel full - Use anti-nausea strategies: ginger tea, peppermint, crackers - Talk to your provider about adjusting the dose if needed - Stool softeners or fiber supplements can help with constipation ?? Staying on Track - Track weight and non-scale victories (energy, clothing fit, labs) - Follow up with your provider regularly - Don't stop medication without medical guidance - Combine medication with healthy habits for best results ?? Remember Weight loss medications are a tool, not a shortcut. Healthy habits matter. Be patient and consistent--small changes lead to big results.  Type 2 diabetes mellitus Type 2 diabetes mellitus managed with Mounjaro . Discussion about the dual receptor action of Mounjaro  compared to Ozempic, which targets a single receptor. Mounjaro  is considered superior for diabetes management. She is concerned about pancreas function and the role of Mounjaro  in weight management. - Continue Mounjaro  therapy - Discuss the dual receptor action of Mounjaro  compared to Ozempic        Follow up plan: Return for follow up scheduled.

## 2024-06-19 NOTE — Telephone Encounter (Signed)
 Informed her of improved ESR to 52 ( was 90 before) and CRP to 1.7 ( was 12)

## 2024-06-19 NOTE — Telephone Encounter (Signed)
 Per Dr. Fayette IV abx extended for 2 more weeks. EOT 07/12/24.  Updated orders sent to [facility].  Destiny Luna, CMA

## 2024-06-21 LAB — LAB REPORT - SCANNED: EGFR: 74

## 2024-06-21 NOTE — Telephone Encounter (Signed)
 Requested Prescriptions  Refused Prescriptions Disp Refills   MOUNJARO  10 MG/0.5ML Pen [Pharmacy Med Name: Mounjaro  10 MG/0.5ML Subcutaneous Solution Pen-injector] 4 mL 0    Sig: INJECT 10MG  SUBCUTANEOUSLY  ONCE A WEEK     Off-Protocol Failed - 06/21/2024  7:52 AM      Failed - Medication not assigned to a protocol, review manually.      Passed - Valid encounter within last 12 months    Recent Outpatient Visits           2 days ago Osteomyelitis of great toe of left foot [facility])   [place] [facility] Gareth Clarity F, FNP   3 weeks ago Diabetic ulcer of left great toe [facility])   [facility] Gareth Clarity F, FNP   2 months ago Type 2 diabetes mellitus with hyperglycemia, with long-term current use of insulin  [facility])   [facility] Gareth Clarity FALCON, FNP       Future Appointments             In 1 week Fayette Bodily, MD [facility]   In 1 month Gareth, Clarity FALCON, FNP [facility]

## 2024-06-27 ENCOUNTER — Telehealth: Payer: Self-pay | Admitting: Podiatry

## 2024-06-27 NOTE — Telephone Encounter (Signed)
 Oow extension 11/29/24 per Dr. Janit and will complete form for pt

## 2024-06-27 NOTE — Telephone Encounter (Signed)
 Will let pt know Dr. Janit said ok to extend leave till 11/29/2024

## 2024-06-28 ENCOUNTER — Telehealth: Payer: Self-pay | Admitting: Podiatry

## 2024-06-28 DIAGNOSIS — Z0271 Encounter for disability determination: Secondary | ICD-10-CM

## 2024-06-28 NOTE — Telephone Encounter (Signed)
 adv pt Dr. Janit said 11/29/24 is ok for RTW. I adv wld fax to [facility] office and she can pick from there. She will call them to see if it can be faxed, if not will get from [facility].

## 2024-07-02 ENCOUNTER — Encounter: Payer: Self-pay | Admitting: Infectious Diseases

## 2024-07-02 ENCOUNTER — Telehealth: Payer: Self-pay

## 2024-07-02 ENCOUNTER — Other Ambulatory Visit
Admission: RE | Admit: 2024-07-02 | Discharge: 2024-07-02 | Disposition: A | Discharge status: Home / Self Care | Source: Ambulatory Visit | Attending: Infectious Diseases | Admitting: Infectious Diseases

## 2024-07-02 ENCOUNTER — Ambulatory Visit: Attending: Infectious Diseases | Admitting: Infectious Diseases

## 2024-07-02 VITALS — BP 138/93 | HR 103 | Temp 97.2°F | Ht 65.0 in | Wt 266.0 lb

## 2024-07-02 DIAGNOSIS — E1142 Type 2 diabetes mellitus with diabetic polyneuropathy: Secondary | ICD-10-CM

## 2024-07-02 DIAGNOSIS — E1140 Type 2 diabetes mellitus with diabetic neuropathy, unspecified: Secondary | ICD-10-CM | POA: Insufficient documentation

## 2024-07-02 DIAGNOSIS — B955 Unspecified streptococcus as the cause of diseases classified elsewhere: Secondary | ICD-10-CM

## 2024-07-02 DIAGNOSIS — F17210 Nicotine dependence, cigarettes, uncomplicated: Secondary | ICD-10-CM | POA: Insufficient documentation

## 2024-07-02 DIAGNOSIS — Z7985 Long-term (current) use of injectable non-insulin antidiabetic drugs: Secondary | ICD-10-CM | POA: Diagnosis not present

## 2024-07-02 DIAGNOSIS — B954 Other streptococcus as the cause of diseases classified elsewhere: Secondary | ICD-10-CM | POA: Insufficient documentation

## 2024-07-02 DIAGNOSIS — Z794 Long term (current) use of insulin: Secondary | ICD-10-CM | POA: Diagnosis not present

## 2024-07-02 DIAGNOSIS — L97524 Non-pressure chronic ulcer of other part of left foot with necrosis of bone: Secondary | ICD-10-CM

## 2024-07-02 DIAGNOSIS — M86172 Other acute osteomyelitis, left ankle and foot: Secondary | ICD-10-CM

## 2024-07-02 DIAGNOSIS — E11621 Type 2 diabetes mellitus with foot ulcer: Secondary | ICD-10-CM

## 2024-07-02 DIAGNOSIS — Z79899 Other long term (current) drug therapy: Secondary | ICD-10-CM | POA: Diagnosis not present

## 2024-07-02 DIAGNOSIS — E1169 Type 2 diabetes mellitus with other specified complication: Secondary | ICD-10-CM | POA: Insufficient documentation

## 2024-07-02 DIAGNOSIS — M84675D Pathological fracture in other disease, left foot, subsequent encounter for fracture with routine healing: Secondary | ICD-10-CM | POA: Insufficient documentation

## 2024-07-02 DIAGNOSIS — B952 Enterococcus as the cause of diseases classified elsewhere: Secondary | ICD-10-CM | POA: Diagnosis not present

## 2024-07-02 LAB — COMPREHENSIVE METABOLIC PANEL WITH GFR
ALT: 20 U/L (ref 0–44)
AST: 20 U/L (ref 15–41)
Albumin: 3.7 g/dL (ref 3.5–5.0)
Alkaline Phosphatase: 88 U/L (ref 38–126)
Anion gap: 11 (ref 5–15)
BUN: 17 mg/dL (ref 6–20)
CO2: 26 mmol/L (ref 22–32)
Calcium: 8.8 mg/dL — ABNORMAL LOW (ref 8.9–10.3)
Chloride: 100 mmol/L (ref 98–111)
Creatinine, Ser: 0.9 mg/dL (ref 0.44–1.00)
GFR, Estimated: >60 mL/min (ref >60)
Glucose, Bld: 204 mg/dL — ABNORMAL HIGH (ref 70–99)
Potassium: 3.7 mmol/L (ref 3.5–5.1)
Sodium: 137 mmol/L (ref 135–145)
Total Bilirubin: 0.7 mg/dL (ref 0.0–1.2)
Total Protein: 7.3 g/dL (ref 6.5–8.1)

## 2024-07-02 LAB — CBC WITH DIFFERENTIAL/PLATELET
Abs Immature Granulocytes: 0.01 K/uL (ref 0.00–0.07)
Basophils Absolute: 0.1 K/uL (ref 0.0–0.1)
Basophils Relative: 1 %
Eosinophils Absolute: 0.2 K/uL (ref 0.0–0.5)
Eosinophils Relative: 3 %
HCT: 41.1 % (ref 36.0–46.0)
Hemoglobin: 12.7 g/dL (ref 12.0–15.0)
Immature Granulocytes: 0 %
Lymphocytes Relative: 45 %
Lymphs Abs: 2.7 K/uL (ref 0.7–4.0)
MCH: 25.7 pg — ABNORMAL LOW (ref 26.0–34.0)
MCHC: 30.9 g/dL (ref 30.0–36.0)
MCV: 83 fL (ref 80.0–100.0)
Monocytes Absolute: 0.6 K/uL (ref 0.1–1.0)
Monocytes Relative: 9 %
Neutro Abs: 2.6 K/uL (ref 1.7–7.7)
Neutrophils Relative %: 42 %
Platelets: 257 K/uL (ref 150–400)
RBC: 4.95 MIL/uL (ref 3.87–5.11)
RDW: 14.4 % (ref 11.5–15.5)
WBC: 6.1 K/uL (ref 4.0–10.5)
nRBC: 0 % (ref 0.0–0.2)

## 2024-07-02 LAB — SEDIMENTATION RATE: Sed Rate: 27 mm/h (ref 0–30)

## 2024-07-02 LAB — C-REACTIVE PROTEIN: CRP: 1.3 mg/dL — ABNORMAL HIGH (ref <1.0)

## 2024-07-02 NOTE — Patient Instructions (Signed)
 Today you are here for the left toe infection- the ulcer has healed almost completely- You can complete the IV antibiotics on 07/09/14. Please keep the toe covered with foam Follow up with Dr.Evans PICC can be removed after IV finishes

## 2024-07-02 NOTE — Telephone Encounter (Signed)
 Message from Dr,. Ravishankar sent to [facility]- Hi this patient will finish IV dapto and Iv ceftriaxone  on 8/12- So tomorrow 's draw for 7 days would be the last-antibiotic draw pam- PICC can be removed on 8/12 or 8/13 after she finishes- Today she is getting labs here- so the nurse need not do labs tomorrow thx

## 2024-07-02 NOTE — Progress Notes (Signed)
 NAME: Destiny Luna  DOB: Jun 19, 1971  MRN: [id number]  Date/Time: 07/02/2024 9:49 AM   Subjective:   ?follow up visit Doing so much better 5th week of Iv dapto and ceftriaxone  Has some loose stools but better than before  Melany Luna is a 53 y.o. with a history  of DM, peripheral neuropathy   Admitted 7/4-06/03/24 at [facility] for left great toe infection.Xray showed new  extensive osteolysis and probable pathologic fracture involving the distal aspect of the left 1st distal phalanx,consistent with osteomyelitis. 2. Stable chronic erosive changes involving the distal 2nd and 3rd phalanges.. pt did not want any surgical intervention like amputation . She had taken doxy and then levaquin  before presenting to the hospital. Culture done in the hopital on 7/5 showed enterococcus fecalis and strep anginosis. She was discharged on IV dapto and ceftriaxone . Last ESR 51 and Crp 1.2 Past Medical History:  Diagnosis Date   Diabetes mellitus without complication (HCC)     Past Surgical History:  Procedure Laterality Date   CESAREAN SECTION     HERNIA REPAIR      Social History   Socioeconomic History   Marital status: Single    Spouse name: Not on file   Number of children: 1   Years of education: Not on file   Highest education level: Bachelor's degree (e.g., BA, AB, BS)  Occupational History   Not on file  Tobacco Use   Smoking status: Former    Current packs/day: 0.25    Average packs/day: 0.3 packs/day for 4.0 years (1.0 ttl pk-yrs)    Types: Cigarettes   Smokeless tobacco: Never   Tobacco comments:    quit for 8 years and restarted  Vaping Use   Vaping status: Never Used  Substance and Sexual Activity   Alcohol use: Not Currently    Comment: rarely   Drug use: Never   Sexual activity: Not Currently  Other Topics Concern   Not on file  Social History Narrative   Not on file   Social Drivers of Health   Financial Resource Strain: Not on file  Food Insecurity: No Food  Insecurity (06/01/2024)   Hunger Vital Sign    Worried About Running Out of Food in the Last Year: Never true    Ran Out of Food in the Last Year: Never true  Transportation Needs: No Transportation Needs (06/01/2024)   PRAPARE - Administrator, Civil Service (Medical): No    Lack of Transportation (Non-Medical): No  Physical Activity: Not on file  Stress: Not on file  Social Connections: Not on file  Intimate Partner Violence: Not At Risk (06/01/2024)   Humiliation, Afraid, Rape, and Kick questionnaire    Fear of Current or Ex-Partner: No    Emotionally Abused: No    Physically Abused: No    Sexually Abused: No    Family History  Problem Relation Age of Onset   Diabetes Mother    CAD Father        CABG x 3   Stroke Father    Diabetes Father    No Known Allergies I? Current Outpatient Medications  Medication Sig Dispense Refill   gentamicin  cream (GARAMYCIN ) 0.1 % Apply 1 Application topically 2 (two) times daily. 30 g 1   insulin  degludec (TRESIBA  FLEXTOUCH) 100 UNIT/ML FlexTouch Pen Inject 60 Units into the skin daily. 9 mL 5   Insulin  Pen Needle 32G X 6 MM MISC 1 each by Does not apply route daily. 50 each 3  ketorolac  (ACULAR ) 0.5 % ophthalmic solution Place 1 drop into the right eye 4 (four) times daily.     methocarbamol  (ROBAXIN ) 500 MG tablet Take 1 tablet (500 mg total) by mouth 2 (two) times daily. 20 tablet 0   metroNIDAZOLE  (FLAGYL ) 500 MG tablet Take 1 tablet (500 mg total) by mouth every 12 (twelve) hours. 78 tablet 0   naproxen  (NAPROSYN ) 500 MG tablet Take 1 tablet (500 mg total) by mouth 2 (two) times daily with a meal. 30 tablet 0   prednisoLONE  acetate (PRED FORTE ) 1 % ophthalmic suspension Place 1 drop into the right eye 4 (four) times daily.     tirzepatide  (MOUNJARO ) 12.5 MG/0.5ML Pen Inject 12.5 mg into the skin once a week. 6 mL 1   No current facility-administered medications for this visit.     Abtx:  Anti-infectives (From admission, onward)     None       REVIEW OF SYSTEMS:  Const: negative fever, negative chills, negative weight loss Eyes: negative diplopia or visual changes, negative eye pain ENT: negative coryza, negative sore throat Resp: negative cough, hemoptysis, dyspnea Cards: negative for chest pain, palpitations, lower extremity edema GU: negative for frequency, dysuria and hematuria GI: Negative for abdominal pain, diarrhea, bleeding, constipation Skin: negative for rash and pruritus Heme: negative for easy bruising and gum/nose bleeding MS: negative for myalgias, arthralgias, back pain and muscle weakness Neurolo:negative for headaches, dizziness, vertigo, memory problems  Psych: anxiety,  Endocrine: , diabetes Allergy/Immunology- negative for any medication or food allergies ?  Objective:  VITALS:  BP (!) 138/93   Pulse (!) 103   Temp (!) 97.2 F (36.2 C) (Temporal)   Ht 5' 5 (1.651 m)   Wt 266 lb (120.7 kg)   SpO2 97%   BMI 44.26 kg/m  RT PICC PHYSICAL EXAM:  General: Alert, cooperative, no distress, appears stated age.  Head: Normocephalic, without obvious abnormality, atraumatic. Eyes: Conjunctivae clear, anicteric sclerae. Pupils are equal ENT Nares normal. No drainage or sinus tenderness. Lips, mucosa, and tongue normal. No Thrush Neck: Supple, symmetrical, no adenopathy, thyroid: non tender no carotid bruit and no JVD. Lungs: Clear to auscultation bilaterally. No Wheezing or Rhonchi. No rales. Heart: Regular rate and rhythm, no murmur, rub or gallop. Abdomen: did not examine Extremities: left great toe ulcer almost healed 07/02/24      06/18/24  06/01/24   Skin: No rashes or lesions. Or bruising Lymph: Cervical, supraclavicular normal. Neurologic: Grossly non-focal Pertinent Labs Lab Results CBC    Component Value Date/Time   WBC 7.2 06/18/2024 1039   RBC 4.68 06/18/2024 1039   HGB 12.2 06/18/2024 1039   HCT 38.7 06/18/2024 1039   PLT 310 06/18/2024 1039   MCV 82.7  06/18/2024 1039   MCH 26.1 06/18/2024 1039   MCHC 31.5 06/18/2024 1039   RDW 14.9 06/18/2024 1039   LYMPHSABS 3.3 06/18/2024 1039   MONOABS 0.7 06/18/2024 1039   EOSABS 0.2 06/18/2024 1039   BASOSABS 0.1 06/18/2024 1039       Latest Ref Rng & Units 06/18/2024   10:39 AM 06/03/2024    3:20 AM 06/02/2024    4:37 AM  CMP  Glucose 70 - 99 mg/dL 808  824  777   BUN 6 - 20 mg/dL 22  25  23    Creatinine 0.44 - 1.00 mg/dL 8.99  8.99  9.08   Sodium 135 - 145 mmol/L 137  135  133   Potassium 3.5 - 5.1 mmol/L 4.0  3.8  3.9   Chloride 98 - 111 mmol/L 99  106  103   CO2 22 - 32 mmol/L 26  21  20    Calcium 8.9 - 10.3 mg/dL 8.8  8.5  8.3   Total Protein 6.5 - 8.1 g/dL 7.4     Total Bilirubin 0.0 - 1.2 mg/dL 0.5     Alkaline Phos 38 - 126 U/L 91     AST 15 - 41 U/L 22     ALT 0 - 44 U/L 22      Erythrocyte Sedimentation Rate     Component Value Date/Time   ESRSEDRATE 52 (H) 06/18/2024 1039    ? Impression/Recommendation ?Diabetic foot ulcer of the left great toe with osteomyelitis of the left distal phalanx with pathological fracture. Enterococcus and strep in culture Has peripheral neuropathy On daptomycin  and ceftriaxone  -She is on week 5 of IV antibiotic Will complete 6 weeks next week- 07/09/24- can remove PICC after that She may not need any oral antibiotics after that if esr and crp normalized or may do Po augmentin  for couple of weeks or so  Labs today    Past history of osteomyelitis of the left great toe was treated with IV antibiotics in 2020.   Diabetes mellitus on insulin    ? ________________________________________________ Discussed with patient,  Follow PRN

## 2024-07-12 ENCOUNTER — Ambulatory Visit (INDEPENDENT_AMBULATORY_CARE_PROVIDER_SITE_OTHER): Admitting: Podiatry

## 2024-07-12 ENCOUNTER — Encounter: Payer: Self-pay | Admitting: Podiatry

## 2024-07-12 ENCOUNTER — Ambulatory Visit (INDEPENDENT_AMBULATORY_CARE_PROVIDER_SITE_OTHER)

## 2024-07-12 VITALS — Ht 65.0 in | Wt 266.0 lb

## 2024-07-12 DIAGNOSIS — L97522 Non-pressure chronic ulcer of other part of left foot with fat layer exposed: Secondary | ICD-10-CM

## 2024-07-12 DIAGNOSIS — E08621 Diabetes mellitus due to underlying condition with foot ulcer: Secondary | ICD-10-CM

## 2024-07-12 NOTE — Progress Notes (Signed)
   Chief Complaint  Patient presents with   Diabetic Ulcer    Pt is here to f/u on left foot due to diabetic ulcer, she states the the ulcer is all healed and feels a lot better, there is still a small spot there but looks better then before.    Subjective:  53 y.o. female with PMHx of diabetes mellitus, last A1c 04/03/2024 was 7.4, presenting today for follow-up evaluation of ulcer to the left great toe.    Brief history: Recently admitted over July 4 weekend.  Refused amputation.  Infectious disease was consulted and she was started on IV antibiotics in combination with Flagyl  500 mg PO twice daily.  Managed currently with Dr. Fayette   Past Medical History:  Diagnosis Date   Diabetes mellitus without complication [facility])     Past Surgical History:  Procedure Laterality Date   CESAREAN SECTION     HERNIA REPAIR      No Known Allergies  Objective/Physical Exam General: The patient is alert and oriented x3 in no acute distress.  Dermatology:  The ulcer to the great toe has healed and resolved completely.  There is no open wound noted today.  Vascular: Palpable pedal pulses bilaterally. No edema or erythema noted. Capillary refill within normal limits.  Neurological: Light touch and protective threshold diminished bilaterally.   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  No prior amputations.  Patient ambulatory.  Persistent edema to the left great toe  DG Foot 2 Views Left 05/31/2024 IMPRESSION: 1. New extensive osteolysis and probable pathologic fracture involving the distal aspect of the left 1st distal phalanx, consistent with osteomyelitis. 2. Stable chronic erosive changes involving the distal 2nd and 3rd phalanges. 3. Stable chronic degenerative changes in the midfoot and 1st metatarsophalangeal joint.  Radiographic exam LT foot 07/12/2024 Stable.  Erosions noted to the distal phalanx of the great toe.  No gas within  the soft tissue.  Assessment: 1.  Ulcer left fifth toe; currently resolved 2. diabetes mellitus w/ peripheral neuropathy 3.  Ulcer distal to the left great toe; healed 4.  Osteomyelitis left great toe  Plan of Care:  -Patient was evaluated.  - Currently the ulcer to the great toe is healed and resolved completely.  No open wound noted. -Will plan to simply observe over the next 6 weeks and return for follow-up x-rays to evaluate the progression of the underlying osteomyelitis -She has completed the IV antibiotics on 07/09/2024 OPAT Orders Discharge antibiotics: Daptomycin  700mg  IV every 24 hrs And ceftriaxone  2 grams Iv every 24 hours + flagyl  500mg  PO BID. 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 Thresa EMERSON Sar, DPM    [address] [place], [place] [address]                Office [phone]  Fax [phone]

## 2024-07-23 ENCOUNTER — Telehealth: Payer: Self-pay | Admitting: Podiatry

## 2024-07-23 ENCOUNTER — Encounter: Payer: Self-pay | Admitting: Podiatry

## 2024-07-23 DIAGNOSIS — Z0271 Encounter for disability determination: Secondary | ICD-10-CM

## 2024-07-23 NOTE — Telephone Encounter (Signed)
 Recd forms from [employer] as well. Faxed to [phone]

## 2024-07-23 NOTE — Telephone Encounter (Signed)
 confirmed with pt again...revised RTW date 09/27/24 approx. See prv notes. updated note faxed to [facility] [phone] and will email pt for her records.

## 2024-07-26 ENCOUNTER — Ambulatory Visit: Payer: Self-pay

## 2024-07-31 ENCOUNTER — Ambulatory Visit: Admitting: Dietician

## 2024-08-05 ENCOUNTER — Encounter: Admitting: Nurse Practitioner

## 2024-08-05 NOTE — Progress Notes (Deleted)
 Name: Destiny Luna   MRN: [id number]    DOB: 18-Dec-1970   Date:08/05/2024       Progress Note  Subjective  Chief Complaint  No chief complaint on file.   HPI  Patient presents for annual CPE.  Diet: *** Exercise: ***  Sleep: *** Last dental exam:*** Last eye exam: ***  Flowsheet Row Office Visit from 08/07/2023 in [facility]  AUDIT-C Score 0   Depression: Phq 9 is  {Desc; negative/positive:13464}    04/03/2024   10:12 AM 08/07/2023   12:59 PM 06/06/2023    9:50 AM 05/01/2023    2:49 PM 05/01/2023    1:56 PM  Depression screen PHQ 2/9  Decreased Interest 0 0 0 0 0  Down, Depressed, Hopeless 0 0 1 0 0  PHQ - 2 Score 0 0 1 0 0  Altered sleeping 0   0   Tired, decreased energy 0   3   Change in appetite 0   3   Feeling bad or failure about yourself  0   1   Trouble concentrating 0   0   Moving slowly or fidgety/restless 0   0   Suicidal thoughts 0   0   PHQ-9 Score 0   7   Difficult doing work/chores Not difficult at all   Not difficult at all    Hypertension: BP Readings from Last 3 Encounters:  07/02/24 (!) 138/93  06/19/24 118/74  06/18/24 124/83   Obesity: Wt Readings from Last 3 Encounters:  07/12/24 120.7 kg  07/02/24 120.7 kg  06/19/24 122 kg   BMI Readings from Last 3 Encounters:  07/12/24 44.26 kg/m  07/02/24 44.26 kg/m  06/19/24 43.42 kg/m     Vaccines:  HPV: up to at age 52 , ask insurance if age between 61-45. Completed 11/13/2019. Last Pap smear? Shingrix: 21-64 yo and ask insurance if covered when patient above 52 yo. Do you want the Shingles vaccine? Pneumonia: Educated and discussed with patient. Do you want the pneumonia vaccine? Flu: Educated and discussed with patient. Do you want the flu vaccine?  Hep C Screening: Completed 05/01/2023 STD testing and prevention (HIV/chl/gon/syphilis): Completed 06/01/2024 Intimate partner violence:*** Sexual History : Menstrual History/LMP/Abnormal Bleeding:  Incontinence  Symptoms:   Breast cancer:  - Last Mammogram: *** - BRCA gene screening: ***  Osteoporosis: Discussed high calcium and vitamin D supplementation, weight bearing exercises  Cervical cancer screening: ***  Skin cancer: Discussed monitoring for atypical lesions  Colorectal cancer: ***   Lung cancer:  *** Low Dose CT Chest recommended if Age 52-80 years, 20 pack-year currently smoking OR have quit w/in 15years. Patient {DOES NOT does:27190::does not} qualify.   ECG: None  Advanced Care Planning: A voluntary discussion about advance care planning including the explanation and discussion of advance directives.  Discussed health care proxy and Living will, and the patient was able to identify a health care proxy as ***.  Patient {DOES_DOES WNU:81435} have a living will at present time. If patient does have living will, I have requested they bring this to the clinic to be scanned in to their chart.   Lipids: Lab Results  Component Value Date   CHOL 199 04/03/2024   CHOL 197 05/01/2023   Lab Results  Component Value Date   HDL 35 (L) 04/03/2024   HDL 46 (L) 05/01/2023   Lab Results  Component Value Date   LDLCALC 125 (H) 04/03/2024   LDLCALC 112 (H) 05/01/2023   Lab  Results  Component Value Date   TRIG 255 (H) 04/03/2024   TRIG 271 (H) 05/01/2023   Lab Results  Component Value Date   CHOLHDL 5.7 (H) 04/03/2024   CHOLHDL 4.3 05/01/2023   No results found for: LDLDIRECT  Glucose: Glucose, Bld  Date Value Ref Range Status  07/02/2024 204 (H) 70 - 99 mg/dL Final    Comment:    Glucose reference range applies only to samples taken after fasting for at least 8 hours.  06/18/2024 191 (H) 70 - 99 mg/dL Final    Comment:    Glucose reference range applies only to samples taken after fasting for at least 8 hours.  06/03/2024 175 (H) 70 - 99 mg/dL Final    Comment:    Glucose reference range applies only to samples taken after fasting for at least 8 hours.    Glucose-Capillary  Date Value Ref Range Status  06/03/2024 151 (H) 70 - 99 mg/dL Final    Comment:    Glucose reference range applies only to samples taken after fasting for at least 8 hours.  06/03/2024 150 (H) 70 - 99 mg/dL Final    Comment:    Glucose reference range applies only to samples taken after fasting for at least 8 hours.  06/02/2024 152 (H) 70 - 99 mg/dL Final    Comment:    Glucose reference range applies only to samples taken after fasting for at least 8 hours.    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 (HCC) 05/31/2024   Mixed hyperlipidemia 12/07/2023   Morbid obesity (HCC) 05/01/2023   Diabetes mellitus (HCC) 05/01/2023    Past Surgical History:  Procedure Laterality Date   CESAREAN SECTION     HERNIA REPAIR      Family History  Problem Relation Age of Onset   Diabetes Mother    CAD Father        CABG x 3   Stroke Father    Diabetes Father     Social History   Socioeconomic History   Marital status: Single    Spouse name: Not on file   Number of children: 1   Years of education: Not on file   Highest education level: Bachelor's degree (e.g., BA, AB, BS)  Occupational History   Not on file  Tobacco Use   Smoking status: Former    Current packs/day: 0.25    Average packs/day: 0.3 packs/day for 4.0 years (1.0 ttl pk-yrs)    Types: Cigarettes   Smokeless tobacco: Never   Tobacco comments:    quit for 8 years and restarted  Vaping Use   Vaping status: Never Used  Substance and Sexual Activity   Alcohol use: Not Currently    Comment: rarely   Drug use: Never   Sexual activity: Not Currently  Other Topics Concern   Not on file  Social History Narrative   Not on file   Social Drivers of Health    Financial Resource Strain: Not on file  Food Insecurity: No Food Insecurity (06/01/2024)   Hunger Vital Sign    Worried About Running Out of Food in the Last Year: Never true    Ran Out of Food in the Last Year: Never true  Transportation Needs:  No Transportation Needs (06/01/2024)   PRAPARE - Administrator, Civil Service (Medical): No    Lack of Transportation (Non-Medical): No  Physical Activity: Not on file  Stress: Not on file  Social Connections: Not on file  Intimate Partner Violence: Not At Risk (06/01/2024)   Humiliation, Afraid, Rape, and Kick questionnaire    Fear of Current or Ex-Partner: No    Emotionally Abused: No    Physically Abused: No    Sexually Abused: No     Current Outpatient Medications:    gentamicin  cream (GARAMYCIN ) 0.1 %, Apply 1 Application topically 2 (two) times daily., Disp: 30 g, Rfl: 1   insulin  degludec (TRESIBA  FLEXTOUCH) 100 UNIT/ML FlexTouch Pen, Inject 60 Units into the skin daily., Disp: 9 mL, Rfl: 5   Insulin  Pen Needle 32G X 6 MM MISC, 1 each by Does not apply route daily., Disp: 50 each, Rfl: 3   ketorolac  (ACULAR ) 0.5 % ophthalmic solution, Place 1 drop into the right eye 4 (four) times daily., Disp: , Rfl:    methocarbamol  (ROBAXIN ) 500 MG tablet, Take 1 tablet (500 mg total) by mouth 2 (two) times daily., Disp: 20 tablet, Rfl: 0   naproxen  (NAPROSYN ) 500 MG tablet, Take 1 tablet (500 mg total) by mouth 2 (two) times daily with a meal., Disp: 30 tablet, Rfl: 0   prednisoLONE  acetate (PRED FORTE ) 1 % ophthalmic suspension, Place 1 drop into the right eye 4 (four) times daily., Disp: , Rfl:    tirzepatide  (MOUNJARO ) 12.5 MG/0.5ML Pen, Inject 12.5 mg into the skin once a week., Disp: 6 mL, Rfl: 1  No Known Allergies   ROS  Constitutional: Negative for fever or weight change.  Respiratory: Negative for cough and shortness of breath.   Cardiovascular: Negative for chest pain or palpitations.  Gastrointestinal: Negative for  abdominal pain, no bowel changes.  Musculoskeletal: Negative for gait problem or joint swelling.  Skin: Negative for rash.  Neurological: Negative for dizziness or headache.  No other specific complaints in a complete review of systems (except as listed in HPI above).   Objective  There were no vitals filed for this visit.  There is no height or weight on file to calculate BMI.  Physical Exam ***  Recent Results (from the past 2160 hours)  House Account Tracking only     Status: None   Collection Time: 05/24/24 12:23 PM  Result Value Ref Range   Tracking House Account      Comment: We were unable to identify an account number for the order submitted. If you do not have a [facility]  account number or if your account information needs  to be updated please call [phone] for assistance. . To prevent delays in testing and processing of your orders please provide the following information for this order and with every additional order submitted: [facility] account number and account name  Client address Client phone and fax number NPI number of ordering physician along with the physician name.   WOUND CULTURE     Status: None   Collection Time: 05/24/24 12:23 PM  Result Value Ref Range   MICRO NUMBER: [id number]    SPECIMEN QUALITY: Adequate    SOURCE: WOUND    STATUS: FINAL    GRAM STAIN: Gram-positive cocci in chains     Comment: No white blood cells seen No epithelial cells seen Many Gram positive cocci in clusters Many Gram positive cocci in chains Few Gram negative bacilli  RESULT:      A mix of organisms of questionable significance was recovered on culture and not further identified. (Note: Growth did not detect the presence of S.aureus, beta-hemolytic Streptococci or P.aeruginosa).  CBC with Differential/Platelet     Status: Abnormal   Collection Time: 05/30/24  3:30 PM  Result Value Ref Range   WBC 9.7 3.8 - 10.8 Thousand/uL   RBC 4.75 3.80  - 5.10 Million/uL   Hemoglobin 12.4 11.7 - 15.5 g/dL   HCT 61.1 64.9 - 54.9 %   MCV 81.7 80.0 - 100.0 fL   MCH 26.1 (L) 27.0 - 33.0 pg   MCHC 32.0 32.0 - 36.0 g/dL    Comment: For adults, a slight decrease in the calculated MCHC value (in the range of 30 to 32 g/dL) is most likely not clinically significant; however, it should be interpreted with caution in correlation with other red cell parameters and the patient's clinical condition.    RDW 15.4 (H) 11.0 - 15.0 %   Platelets 331 140 - 400 Thousand/uL   MPV 10.2 7.5 - 12.5 fL   Neutro Abs 4,695 1,500 - 7,800 cells/uL   Absolute Lymphocytes 3,832 850 - 3,900 cells/uL   Absolute Monocytes 999 (H) 200 - 950 cells/uL   Eosinophils Absolute 126 15 - 500 cells/uL   Basophils Absolute 49 0 - 200 cells/uL   Neutrophils Relative % 48.4 %   Total Lymphocyte 39.5 %   Monocytes Relative 10.3 %   Eosinophils Relative 1.3 %   Basophils Relative 0.5 %  Sedimentation rate     Status: Abnormal   Collection Time: 05/30/24  3:30 PM  Result Value Ref Range   Sed Rate 75 (H) 0 - 30 mm/h  C-reactive protein     Status: Abnormal   Collection Time: 05/30/24  3:30 PM  Result Value Ref Range   CRP 151.0 (H) <8.0 mg/L  CBC with Differential     Status: Abnormal   Collection Time: 05/31/24  1:21 PM  Result Value Ref Range   WBC 9.5 4.0 - 10.5 K/uL   RBC 4.56 3.87 - 5.11 MIL/uL   Hemoglobin 11.7 (L) 12.0 - 15.0 g/dL   HCT 62.3 63.9 - 53.9 %   MCV 82.5 80.0 - 100.0 fL   MCH 25.7 (L) 26.0 - 34.0 pg   MCHC 31.1 30.0 - 36.0 g/dL   RDW 84.7 88.4 - 84.4 %   Platelets 304 150 - 400 K/uL   nRBC 0.0 0.0 - 0.2 %   Neutrophils Relative % 54 %   Neutro Abs 5.0 1.7 - 7.7 K/uL   Lymphocytes Relative 36 %   Lymphs Abs 3.5 0.7 - 4.0 K/uL   Monocytes Relative 9 %   Monocytes Absolute 0.9 0.1 - 1.0 K/uL   Eosinophils Relative 1 %   Eosinophils Absolute 0.1 0.0 - 0.5 K/uL   Basophils Relative 0 %   Basophils Absolute 0.0 0.0 - 0.1 K/uL   Immature  Granulocytes 0 %   Abs Immature Granulocytes 0.02 0.00 - 0.07 K/uL    Comment: Performed at [facility], [address] [place], [place] [address]  Comprehensive metabolic panel     Status: Abnormal   Collection Time: 05/31/24  1:21 PM  Result Value Ref Range   Sodium 135 135 - 145 mmol/L   Potassium 4.6 3.5 - 5.1 mmol/L   Chloride 102 98 - 111 mmol/L   CO2 24 22 - 32 mmol/L   Glucose, Bld 201 (  H) 70 - 99 mg/dL    Comment: Glucose reference range applies only to samples taken after fasting for at least 8 hours.   BUN 25 (H) 6 - 20 mg/dL   Creatinine, Ser 8.73 (H) 0.44 - 1.00 mg/dL   Calcium 8.7 (L) 8.9 - 10.3 mg/dL   Total Protein 7.4 6.5 - 8.1 g/dL   Albumin 3.3 (L) 3.5 - 5.0 g/dL   AST 13 (L) 15 - 41 U/L   ALT 15 0 - 44 U/L   Alkaline Phosphatase 93 38 - 126 U/L   Total Bilirubin 0.4 0.0 - 1.2 mg/dL   GFR, Estimated 51 (L) >60 mL/min    Comment: (NOTE) Calculated using the CKD-EPI Creatinine Equation (2021)    Anion gap 9 5 - 15    Comment: Performed at [facility], [address] [place], [place] [address]  Sedimentation rate     Status: Abnormal   Collection Time: 05/31/24  4:41 PM  Result Value Ref Range   Sed Rate 76 (H) 0 - 30 mm/hr    Comment: Performed at [facility], [address] [place], [place] [address]  Lactic acid, plasma     Status: Abnormal   Collection Time: 05/31/24  4:41 PM  Result Value Ref Range   Lactic Acid, Venous 2.1 (HH) 0.5 - 1.9 mmol/L    Comment: CRITICAL RESULT CALLED TO, READ BACK BY AND VERIFIED WITH Cheney, Alaina @1725  on 05/31/24 skl Performed at [facility], [address] [place], [place] [address]   Lactic acid, plasma     Status: None   Collection Time: 05/31/24  8:54 PM  Result Value Ref Range   Lactic Acid, Venous 1.3 0.5 - 1.9 mmol/L    Comment: Performed at [facility], [address] [place], [place] [address]  Glucose, capillary     Status: Abnormal   Collection  Time: 05/31/24  8:57 PM  Result Value Ref Range   Glucose-Capillary 227 (H) 70 - 99 mg/dL    Comment: Glucose reference range applies only to samples taken after fasting for at least 8 hours.   Comment 1 Notify RN   Culture, blood (Routine X 2) w Reflex to ID Panel     Status: None   Collection Time: 05/31/24 10:13 PM   Specimen: BLOOD  Result Value Ref Range   Specimen Description BLOOD BLOOD LEFT HAND    Special Requests      BOTTLES DRAWN AEROBIC AND ANAEROBIC Blood Culture adequate volume   Culture      NO GROWTH 5 DAYS Performed at [facility], [address] [place], [place] [address]    Report Status 06/05/2024 FINAL   Culture, blood (Routine X 2) w Reflex to ID Panel     Status: None   Collection Time: 05/31/24 10:13 PM   Specimen: BLOOD  Result Value Ref Range   Specimen Description BLOOD BLOOD RIGHT HAND    Special Requests      Blood Culture results may not be optimal due to an inadequate volume of blood received in culture bottles   Culture      NO GROWTH 5 DAYS Performed at [facility], [address], [place], [place] [address]    Report Status 06/05/2024 FINAL   HIV Antibody (routine testing w rflx)     Status: None   Collection Time: 06/01/24  5:18 AM  Result Value Ref Range   HIV Screen 4th Generation wRfx Non Reactive  Non Reactive    Comment: Performed at [facility], [address], [place], [place] [address]  Hemoglobin A1c     Status: Abnormal   Collection Time: 06/01/24  5:18 AM  Result Value Ref Range   Hgb A1c MFr Bld 7.5 (H) 4.8 - 5.6 %    Comment: (NOTE) Diagnosis of Diabetes The following HbA1c ranges recommended by the American Diabetes Association (ADA) may be used as an aid in the diagnosis of diabetes mellitus.  Hemoglobin             Suggested A1C NGSP%              Diagnosis  <5.7                   Non Diabetic  5.7-6.4                Pre-Diabetic  >6.4                   Diabetic  <7.0                    Glycemic control for                       adults with diabetes.     Mean Plasma Glucose 168.55 mg/dL    Comment: Performed at [facility], [address], [place], [place] [address]  CBC     Status: Abnormal   Collection Time: 06/01/24  5:18 AM  Result Value Ref Range   WBC 7.3 4.0 - 10.5 K/uL   RBC 4.25 3.87 - 5.11 MIL/uL   Hemoglobin 11.0 (L) 12.0 - 15.0 g/dL   HCT 65.3 (L) 63.9 - 53.9 %   MCV 81.4 80.0 - 100.0 fL   MCH 25.9 (L) 26.0 - 34.0 pg   MCHC 31.8 30.0 - 36.0 g/dL   RDW 84.7 88.4 - 84.4 %   Platelets 278 150 - 400 K/uL   nRBC 0.0 0.0 - 0.2 %    Comment: Performed at [facility], [address] [place], [place] [address]  C-reactive protein     Status: Abnormal   Collection Time: 06/01/24  5:18 AM  Result Value Ref Range   CRP 12.1 (H) <1.0 mg/dL    Comment: Performed at [facility], [address], [place], [place] [address]  Basic metabolic panel with GFR     Status: Abnormal   Collection Time: 06/01/24  5:18 AM  Result Value Ref Range   Sodium 136 135 - 145 mmol/L   Potassium 4.1 3.5 - 5.1 mmol/L   Chloride 104 98 - 111 mmol/L   CO2 23 22 - 32 mmol/L   Glucose, Bld 190 (H) 70 - 99 mg/dL    Comment: Glucose reference range applies only to samples taken after fasting for at least 8 hours.   BUN 24 (H) 6 - 20 mg/dL   Creatinine, Ser 8.88 (H) 0.44 - 1.00 mg/dL   Calcium 8.3 (L) 8.9 - 10.3 mg/dL   GFR, Estimated 60 (L) >60 mL/min    Comment: (NOTE) Calculated using the CKD-EPI Creatinine Equation (2021)    Anion gap 9 5 - 15    Comment: Performed at [facility], [address] [place], [place] [address]  Glucose, capillary     Status: Abnormal   Collection Time: 06/01/24  8:21 AM  Result Value Ref Range  Glucose-Capillary 160 (H) 70 - 99 mg/dL    Comment: Glucose reference range applies only to samples taken after fasting for at least 8 hours.  Glucose, capillary     Status: Abnormal   Collection Time: 06/01/24 12:05 PM   Result Value Ref Range   Glucose-Capillary 169 (H) 70 - 99 mg/dL    Comment: Glucose reference range applies only to samples taken after fasting for at least 8 hours.  Aerobic/Anaerobic Culture w Gram Stain (surgical/deep wound)     Status: None   Collection Time: 06/01/24  3:57 PM   Specimen: Wound; Tissue  Result Value Ref Range   Specimen Description      WOUND Performed at [facility], [address] [place], [place] [address]    Special Requests      LEFT FOOT Performed at [facility], [address] [place], [place] [address]    Gram Stain      RARE WBC PRESENT, PREDOMINANTLY PMN NO ORGANISMS SEEN    Culture      RARE ENTEROCOCCUS FAECALIS RARE STREPTOCOCCUS ANGINOSIS NO ANAEROBES ISOLATED Performed at [facility], [address], [place], [place] [address]    Report Status 06/06/2024 FINAL    Organism ID, Bacteria ENTEROCOCCUS FAECALIS    Organism ID, Bacteria STREPTOCOCCUS ANGINOSIS       Susceptibility   Enterococcus faecalis - MIC*    AMPICILLIN <=2 SENSITIVE Sensitive     VANCOMYCIN  1 SENSITIVE Sensitive     GENTAMICIN  SYNERGY SENSITIVE Sensitive     * RARE ENTEROCOCCUS FAECALIS   Streptococcus anginosis - MIC*    PENICILLIN <=0.06 SENSITIVE Sensitive     CEFTRIAXONE  <=0.12 SENSITIVE Sensitive     ERYTHROMYCIN 4 RESISTANT Resistant     LEVOFLOXACIN  0.5 SENSITIVE Sensitive     VANCOMYCIN  0.5 SENSITIVE Sensitive     * RARE STREPTOCOCCUS ANGINOSIS  Glucose, capillary     Status: Abnormal   Collection Time: 06/01/24  5:05 PM  Result Value Ref Range   Glucose-Capillary 165 (H) 70 - 99 mg/dL    Comment: Glucose reference range applies only to samples taken after fasting for at least 8 hours.  Glucose, capillary     Status: Abnormal   Collection Time: 06/01/24  9:38 PM  Result Value Ref Range   Glucose-Capillary 183 (H) 70 - 99 mg/dL    Comment: Glucose reference range applies only to samples taken after fasting for at least 8  hours.  CBC     Status: Abnormal   Collection Time: 06/02/24  4:37 AM  Result Value Ref Range   WBC 7.8 4.0 - 10.5 K/uL   RBC 4.10 3.87 - 5.11 MIL/uL   Hemoglobin 10.5 (L) 12.0 - 15.0 g/dL   HCT 66.3 (L) 63.9 - 53.9 %   MCV 82.0 80.0 - 100.0 fL   MCH 25.6 (L) 26.0 - 34.0 pg   MCHC 31.3 30.0 - 36.0 g/dL   RDW 84.7 88.4 - 84.4 %   Platelets 291 150 - 400 K/uL   nRBC 0.0 0.0 - 0.2 %    Comment: Performed at [facility], [address] [place], [place] [address]  Basic metabolic panel with GFR     Status: Abnormal   Collection Time: 06/02/24  4:37 AM  Result Value Ref Range   Sodium 133 (L) 135 - 145 mmol/L   Potassium 3.9 3.5 - 5.1 mmol/L   Chloride 103 98 - 111 mmol/L   CO2 20 (L) 22 -  32 mmol/L   Glucose, Bld 222 (H) 70 - 99 mg/dL    Comment: Glucose reference range applies only to samples taken after fasting for at least 8 hours.   BUN 23 (H) 6 - 20 mg/dL   Creatinine, Ser 9.08 0.44 - 1.00 mg/dL   Calcium 8.3 (L) 8.9 - 10.3 mg/dL   GFR, Estimated >39 >39 mL/min    Comment: (NOTE) Calculated using the CKD-EPI Creatinine Equation (2021)    Anion gap 10 5 - 15    Comment: Performed at [facility], [address] [place], [place] [address]  Glucose, capillary     Status: Abnormal   Collection Time: 06/02/24  8:19 AM  Result Value Ref Range   Glucose-Capillary 189 (H) 70 - 99 mg/dL    Comment: Glucose reference range applies only to samples taken after fasting for at least 8 hours.  Glucose, capillary     Status: Abnormal   Collection Time: 06/02/24 11:52 AM  Result Value Ref Range   Glucose-Capillary 223 (H) 70 - 99 mg/dL    Comment: Glucose reference range applies only to samples taken after fasting for at least 8 hours.  Glucose, capillary     Status: Abnormal   Collection Time: 06/02/24  5:12 PM  Result Value Ref Range   Glucose-Capillary 188 (H) 70 - 99 mg/dL    Comment: Glucose reference range applies only to samples taken after fasting for at  least 8 hours.  Glucose, capillary     Status: Abnormal   Collection Time: 06/02/24  7:40 PM  Result Value Ref Range   Glucose-Capillary 152 (H) 70 - 99 mg/dL    Comment: Glucose reference range applies only to samples taken after fasting for at least 8 hours.  CBC     Status: Abnormal   Collection Time: 06/03/24  3:20 AM  Result Value Ref Range   WBC 7.3 4.0 - 10.5 K/uL   RBC 4.26 3.87 - 5.11 MIL/uL   Hemoglobin 10.8 (L) 12.0 - 15.0 g/dL   HCT 64.5 (L) 63.9 - 53.9 %   MCV 83.1 80.0 - 100.0 fL   MCH 25.4 (L) 26.0 - 34.0 pg   MCHC 30.5 30.0 - 36.0 g/dL   RDW 84.8 88.4 - 84.4 %   Platelets 305 150 - 400 K/uL   nRBC 0.0 0.0 - 0.2 %    Comment: Performed at [facility], [address] [place], [place] [address]  Basic metabolic panel with GFR     Status: Abnormal   Collection Time: 06/03/24  3:20 AM  Result Value Ref Range   Sodium 135 135 - 145 mmol/L   Potassium 3.8 3.5 - 5.1 mmol/L   Chloride 106 98 - 111 mmol/L   CO2 21 (L) 22 - 32 mmol/L   Glucose, Bld 175 (H) 70 - 99 mg/dL    Comment: Glucose reference range applies only to samples taken after fasting for at least 8 hours.   BUN 25 (H) 6 - 20 mg/dL   Creatinine, Ser 8.99 0.44 - 1.00 mg/dL   Calcium 8.5 (L) 8.9 - 10.3 mg/dL   GFR, Estimated >39 >39 mL/min    Comment: (NOTE) Calculated using the CKD-EPI Creatinine Equation (2021)    Anion gap 8 5 - 15    Comment: Performed at [facility], [address] [place], [place] [address]  CK     Status: Abnormal   Collection Time: 06/03/24  3:20 AM  Result Value Ref  Range   Total CK 30 (L) 38 - 234 U/L    Comment: Performed at [facility], [address] [place], [place] [address]  Glucose, capillary     Status: Abnormal   Collection Time: 06/03/24  8:04 AM  Result Value Ref Range   Glucose-Capillary 150 (H) 70 - 99 mg/dL    Comment: Glucose reference range applies only to samples taken after fasting for at least 8 hours.  Glucose, capillary      Status: Abnormal   Collection Time: 06/03/24 11:56 AM  Result Value Ref Range   Glucose-Capillary 151 (H) 70 - 99 mg/dL    Comment: Glucose reference range applies only to samples taken after fasting for at least 8 hours.  C-reactive protein     Status: Abnormal   Collection Time: 06/18/24 10:39 AM  Result Value Ref Range   CRP 1.7 (H) <1.0 mg/dL    Comment: Performed at [facility], [address] [place], [place] [address]  Sedimentation rate     Status: Abnormal   Collection Time: 06/18/24 10:39 AM  Result Value Ref Range   Sed Rate 52 (H) 0 - 30 mm/hr    Comment: Performed at [facility], [address] [place], [place] [address]  CBC with Differential/Platelet     Status: None   Collection Time: 06/18/24 10:39 AM  Result Value Ref Range   WBC 7.2 4.0 - 10.5 K/uL   RBC 4.68 3.87 - 5.11 MIL/uL   Hemoglobin 12.2 12.0 - 15.0 g/dL   HCT 61.2 63.9 - 53.9 %   MCV 82.7 80.0 - 100.0 fL   MCH 26.1 26.0 - 34.0 pg   MCHC 31.5 30.0 - 36.0 g/dL   RDW 85.0 88.4 - 84.4 %   Platelets 310 150 - 400 K/uL   nRBC 0.0 0.0 - 0.2 %   Neutrophils Relative % 42 %   Neutro Abs 3.0 1.7 - 7.7 K/uL   Lymphocytes Relative 46 %   Lymphs Abs 3.3 0.7 - 4.0 K/uL   Monocytes Relative 9 %   Monocytes Absolute 0.7 0.1 - 1.0 K/uL   Eosinophils Relative 2 %   Eosinophils Absolute 0.2 0.0 - 0.5 K/uL   Basophils Relative 1 %   Basophils Absolute 0.1 0.0 - 0.1 K/uL   Immature Granulocytes 0 %   Abs Immature Granulocytes 0.02 0.00 - 0.07 K/uL    Comment: Performed at [facility], [address] [place], [place] [address]  Comprehensive metabolic panel with GFR     Status: Abnormal   Collection Time: 06/18/24 10:39 AM  Result Value Ref Range   Sodium 137 135 - 145 mmol/L   Potassium 4.0 3.5 - 5.1 mmol/L   Chloride 99 98 - 111 mmol/L   CO2 26 22 - 32 mmol/L   Glucose, Bld 191 (H) 70 - 99 mg/dL    Comment: Glucose reference range applies only to samples taken after fasting  for at least 8 hours.   BUN 22 (H) 6 - 20 mg/dL   Creatinine, Ser 8.99 0.44 - 1.00 mg/dL   Calcium 8.8 (L) 8.9 - 10.3 mg/dL   Total Protein 7.4 6.5 - 8.1 g/dL   Albumin 3.4 (L) 3.5 - 5.0 g/dL   AST 22 15 - 41 U/L   ALT 22 0 - 44 U/L   Alkaline Phosphatase 91 38 - 126 U/L   Total Bilirubin 0.5 0.0 - 1.2 mg/dL   GFR, Estimated >39 >39  mL/min    Comment: (NOTE) Calculated using the CKD-EPI Creatinine Equation (2021)    Anion gap 12 5 - 15    Comment: Performed at [facility], [address] [place], [place] [address]  CK     Status: None   Collection Time: 06/18/24 10:39 AM  Result Value Ref Range   Total CK 122 38 - 234 U/L    Comment: Performed at [facility], [address] [place], [place] [address]  Lab report - scanned     Status: None   Collection Time: 06/21/24 12:00 AM  Result Value Ref Range   EGFR 74.0     Comment: Abstracted by HIM  Sedimentation rate     Status: None   Collection Time: 07/02/24 10:21 AM  Result Value Ref Range   Sed Rate 27 0 - 30 mm/hr    Comment: Performed at [facility], [address] [place], [place] [address]  Comprehensive metabolic panel with GFR     Status: Abnormal   Collection Time: 07/02/24 10:21 AM  Result Value Ref Range   Sodium 137 135 - 145 mmol/L   Potassium 3.7 3.5 - 5.1 mmol/L   Chloride 100 98 - 111 mmol/L   CO2 26 22 - 32 mmol/L   Glucose, Bld 204 (H) 70 - 99 mg/dL    Comment: Glucose reference range applies only to samples taken after fasting for at least 8 hours.   BUN 17 6 - 20 mg/dL   Creatinine, Ser 9.09 0.44 - 1.00 mg/dL   Calcium 8.8 (L) 8.9 - 10.3 mg/dL   Total Protein 7.3 6.5 - 8.1 g/dL   Albumin 3.7 3.5 - 5.0 g/dL   AST 20 15 - 41 U/L   ALT 20 0 - 44 U/L   Alkaline Phosphatase 88 38 - 126 U/L   Total Bilirubin 0.7 0.0 - 1.2 mg/dL   GFR, Estimated >39 >39 mL/min    Comment: (NOTE) Calculated using the CKD-EPI Creatinine Equation (2021)    Anion gap 11 5 - 15    Comment:  Performed at [facility], [address] [place], [place] [address]  CBC with Differential/Platelet     Status: Abnormal   Collection Time: 07/02/24 10:21 AM  Result Value Ref Range   WBC 6.1 4.0 - 10.5 K/uL   RBC 4.95 3.87 - 5.11 MIL/uL   Hemoglobin 12.7 12.0 - 15.0 g/dL   HCT 58.8 63.9 - 53.9 %   MCV 83.0 80.0 - 100.0 fL   MCH 25.7 (L) 26.0 - 34.0 pg   MCHC 30.9 30.0 - 36.0 g/dL   RDW 85.5 88.4 - 84.4 %   Platelets 257 150 - 400 K/uL   nRBC 0.0 0.0 - 0.2 %   Neutrophils Relative % 42 %   Neutro Abs 2.6 1.7 - 7.7 K/uL   Lymphocytes Relative 45 %   Lymphs Abs 2.7 0.7 - 4.0 K/uL   Monocytes Relative 9 %   Monocytes Absolute 0.6 0.1 - 1.0 K/uL   Eosinophils Relative 3 %   Eosinophils Absolute 0.2 0.0 - 0.5 K/uL   Basophils Relative 1 %   Basophils Absolute 0.1 0.0 - 0.1 K/uL   Immature Granulocytes 0 %   Abs Immature Granulocytes 0.01 0.00 - 0.07 K/uL    Comment: Performed at [facility], [address] [place], [place] [address]  C-reactive protein     Status: Abnormal   Collection Time: 07/02/24 10:25 AM  Result  Value Ref Range   CRP 1.3 (H) <1.0 mg/dL    Comment: Performed at [facility], [address] [place], [place] [address]    Diabetic Foot Exam: Diabetic Foot Exam - Simple   No data filed    ***  Fall Risk:    04/03/2024   10:12 AM 08/07/2023   12:59 PM 06/06/2023    9:50 AM 05/01/2023    1:55 PM  Fall Risk   Falls in the past year? 0 0 0 0  Number falls in past yr: 0 0 0 0  Injury with Fall? 0 0 0 0  Follow up Falls evaluation completed      ***  Functional Status Survey:   ***  Assessment & Plan  There are no diagnoses linked to this encounter.  -USPSTF grade A and B recommendations reviewed with patient; age-appropriate recommendations, preventive care, screening tests, etc discussed and encouraged; healthy living encouraged; see AVS for patient education given to patient -Discussed importance of 150 minutes of  physical activity weekly, eat two servings of fish weekly, eat one serving of tree nuts ( cashews, pistachios, pecans, almonds.SABRA) every other day, eat 6 servings of fruit/vegetables daily and drink plenty of water and avoid sweet beverages.   -Reviewed Health Maintenance: Yes.

## 2024-08-20 ENCOUNTER — Ambulatory Visit (INDEPENDENT_AMBULATORY_CARE_PROVIDER_SITE_OTHER)

## 2024-08-20 ENCOUNTER — Ambulatory Visit (INDEPENDENT_AMBULATORY_CARE_PROVIDER_SITE_OTHER): Admitting: Podiatry

## 2024-08-20 ENCOUNTER — Encounter: Payer: Self-pay | Admitting: Podiatry

## 2024-08-20 DIAGNOSIS — L97522 Non-pressure chronic ulcer of other part of left foot with fat layer exposed: Secondary | ICD-10-CM

## 2024-08-20 DIAGNOSIS — E08621 Diabetes mellitus due to underlying condition with foot ulcer: Secondary | ICD-10-CM | POA: Diagnosis not present

## 2024-08-20 DIAGNOSIS — M7751 Other enthesopathy of right foot: Secondary | ICD-10-CM | POA: Diagnosis not present

## 2024-08-20 DIAGNOSIS — M7661 Achilles tendinitis, right leg: Secondary | ICD-10-CM | POA: Diagnosis not present

## 2024-08-20 MED ORDER — MELOXICAM 15 MG PO TABS: 15.0000 mg | ORAL_TABLET | Freq: Every day | ORAL | 1 refills | Status: AC

## 2024-08-20 NOTE — Progress Notes (Signed)
 Chief Complaint  Patient presents with   Diabetic Ulcer    Pt is here to f/u on left foot due to diabetic ulcer, she states the foot is fine, complains about right ankle states the 3rd week of August while working out she noticed some discomfort in the ankle, ankle is swollen and hard to walk on.    Subjective:  53 y.o. female with PMHx of diabetes mellitus, last A1c 04/03/2024 was 7.4, presenting today for follow-up evaluation of ulcer to the left great toe.  Left great toe is doing well.  She says the wounds are healed and she has no swelling to the toe with activity  Brief history: Recently admitted over July 4 weekend.  Refused amputation.  Infectious disease was consulted and she was started on IV antibiotics in combination with Flagyl  500 mg PO twice daily.  Managed currently with Dr. Fayette  New complaint today of right posterior heel pain ongoing for few weeks now.  This started when she began exercising and walking uphill specifically.   Past Medical History:  Diagnosis Date   Diabetes mellitus without complication (HCC)     Past Surgical History:  Procedure Laterality Date   CESAREAN SECTION     HERNIA REPAIR      No Known Allergies  Objective/Physical Exam General: The patient is alert and oriented x3 in no acute distress.  Dermatology:  The ulcer to the great toe has healed and resolved completely.  There is no open wound noted today.  Vascular: Palpable pedal pulses bilaterally. No edema or erythema noted. Capillary refill within normal limits.  Neurological: Light touch and protective threshold diminished bilaterally.   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  No prior amputations.  Patient ambulatory.  No edema noted to the great toe  Tenderness with palpation noted along the posterior heel and Achilles tendon as it inserts onto the posterior tubercle of the calcaneus.  Muscle strength 5/5 all  compartments  DG Foot 2 Views Left 05/31/2024 IMPRESSION: 1. New extensive osteolysis and probable pathologic fracture involving the distal aspect of the left 1st distal phalanx, consistent with osteomyelitis. 2. Stable chronic erosive changes involving the distal 2nd and 3rd phalanges. 3. Stable chronic degenerative changes in the midfoot and 1st metatarsophalangeal joint.  Radiographic exam LT foot 08/20/2024 Stable.  Erosions noted to the distal phalanx of the great toe.  No gas within the soft tissue.  Radiographic exam RT foot 08/20/2024 Normal osseous mineralization.  No acute fractures identified.  Posterior heel spur with calcifications and irregularity along the posterior tubercle of the calcaneus consistent with insertional Achilles tendinitis/tendinosis  Assessment: 1.  Ulcer left fifth toe; currently resolved 2. diabetes mellitus w/ peripheral neuropathy 3.  Ulcer distal to the left great toe; healed 4.  Osteomyelitis left great toe 5.  Achilles tendinitis right  Plan of Care:  -Patient was evaluated.  -Recommend conservative treatment and management for the Achilles tendinitis to the right lower extremity. -Recommend reduced activity and advised against walking uphill or treadmill with an incline.  Recommend light daily stretching exercises throughout the day which were demonstrated today -Prescription for meloxicam  15 mg daily -Patient ultimately declined cam boot -Return to clinic PRN  - Currently the ulcer to the great toe is healed and resolved completely.  No open wound noted. -She has completed the IV antibiotics on 07/09/2024 - Continue to observe OPAT Orders Discharge antibiotics: Daptomycin  700mg  IV every 24 hrs And ceftriaxone  2 grams Iv  every 24 hours + flagyl  500mg  PO BID. 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 Thresa EMERSON Sar, DPM     [address] [place], [place] [address]                Office [phone]  Fax [phone]

## 2024-08-23 ENCOUNTER — Ambulatory Visit: Admitting: Podiatry

## 2024-08-27 ENCOUNTER — Telehealth: Payer: Self-pay | Admitting: Podiatry

## 2024-08-27 NOTE — Telephone Encounter (Signed)
 pt lft mess [employer] [place] will be sending forms. She is out of town and I advised she would have to pay 25.00 via billing once charge entered and then I can complete form with revised RTW 10/27/24

## 2024-08-28 ENCOUNTER — Telehealth: Payer: Self-pay | Admitting: Podiatry

## 2024-08-28 ENCOUNTER — Encounter: Payer: Self-pay | Admitting: Podiatry

## 2024-08-28 DIAGNOSIS — Z0279 Encounter for issue of other medical certificate: Secondary | ICD-10-CM

## 2024-08-28 NOTE — Telephone Encounter (Signed)
 pt cld bk and lft mess on my vmail. I called her and lft mess for her to pay 25.00 fee and lft biling # [phone] to pay and then I can complete.

## 2024-08-28 NOTE — Telephone Encounter (Signed)
 Recd form from [employer]. Pt has to pay fee and then will complete

## 2024-08-29 NOTE — Telephone Encounter (Signed)
 s/w pt and advise her to pay when she gets back in town. I will go ahead and fax [facility] and email her as well. She tried to pay online and could not. see prev notes

## 2024-09-02 ENCOUNTER — Other Ambulatory Visit (HOSPITAL_COMMUNITY): Payer: Self-pay

## 2024-09-12 LAB — OPHTHALMOLOGY REPORT-SCANNED

## 2024-09-23 ENCOUNTER — Telehealth: Payer: Self-pay | Admitting: Lab

## 2024-09-23 NOTE — Telephone Encounter (Signed)
 Patient called in wanting to speak to provider has question and needs to speak to him didn't state what is was regarding.

## 2024-09-30 ENCOUNTER — Other Ambulatory Visit: Payer: Self-pay | Admitting: Podiatry

## 2024-09-30 ENCOUNTER — Encounter: Payer: Self-pay | Admitting: Podiatry

## 2024-09-30 ENCOUNTER — Telehealth: Payer: Self-pay | Admitting: Podiatry

## 2024-09-30 NOTE — Progress Notes (Signed)
 Spoke to patient this evening. Continued complications associated to the osteomyelitis of the foot. States that her foot is uncomfortable. Resume postop shoe for now. F/u in clinic for f/u xrays.    Thresa EMERSON Sar, DPM [facility]  Dr. Thresa EMERSON Sar, DPM    [address] [place], [place] [address]                Office [phone]  Fax [phone]

## 2024-09-30 NOTE — Telephone Encounter (Signed)
 Read the message note from 08/29/24. She still currently out of town and would like for you to call her back. Need to confirm her return date to work.

## 2024-10-01 NOTE — Telephone Encounter (Signed)
 cld pt back. she said she s/w Dr. Janit last night and she got it straight.

## 2024-10-08 NOTE — Telephone Encounter (Signed)
 Recd same forms that were faxed 08/29/24 with revised  RTW 10/27/24. Did no fill out since the same and would be another $25 fee. See prev form fees.

## 2024-10-16 ENCOUNTER — Ambulatory Visit (INDEPENDENT_AMBULATORY_CARE_PROVIDER_SITE_OTHER): Admitting: Podiatry

## 2024-10-16 ENCOUNTER — Encounter: Payer: Self-pay | Admitting: Podiatry

## 2024-10-16 ENCOUNTER — Ambulatory Visit (INDEPENDENT_AMBULATORY_CARE_PROVIDER_SITE_OTHER)

## 2024-10-16 VITALS — Ht 65.0 in | Wt 266.0 lb

## 2024-10-16 DIAGNOSIS — E08621 Diabetes mellitus due to underlying condition with foot ulcer: Secondary | ICD-10-CM

## 2024-10-16 DIAGNOSIS — L97522 Non-pressure chronic ulcer of other part of left foot with fat layer exposed: Secondary | ICD-10-CM

## 2024-10-16 NOTE — Progress Notes (Signed)
 No chief complaint on file.   Subjective:  53 y.o. female with PMHx of diabetes mellitus, last A1c 04/03/2024 was 7.4, presenting today for follow-up evaluation of ulcer to the left great toe.  She states that the wound has healed.  Her foot is doing very well.  Brief history: Admitted over May 31, 2024 weekend.  Refused amputation.  Infectious disease was consulted and she was started on IV antibiotics in combination with Flagyl  500 mg PO twice daily.  Managed currently with Dr. RaviShankar OPAT Orders Discharge antibiotics: Daptomycin  700mg  IV every 24 hrs And ceftriaxone  2 grams Iv every 24 hours + flagyl  500mg  PO BID. End date- 06/28/24 May extend for another 2 weeks  New complaint today of right posterior heel pain ongoing for few weeks now.  This started when she began exercising and walking uphill specifically.   Past Medical History:  Diagnosis Date   Diabetes mellitus without complication (HCC)     Past Surgical History:  Procedure Laterality Date   CESAREAN SECTION     HERNIA REPAIR      No Known Allergies  Objective/Physical Exam General: The patient is alert and oriented x3 in no acute distress.  Dermatology:  The ulcer to the great toe has healed and resolved completely.  There is no open wound noted today.  No erythema or edema concerning for acute infection  Vascular: Palpable pedal pulses bilaterally. No edema or erythema noted. Capillary refill within normal limits.  Neurological: Light touch and protective threshold diminished bilaterally.   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  No prior amputations.  Patient ambulatory.  No edema noted to the great toe  Tenderness with palpation noted along the posterior heel and Achilles tendon as it inserts onto the posterior tubercle of the calcaneus.  Muscle strength 5/5 all compartments  DG Foot 2 Views Left 05/31/2024 IMPRESSION: 1. New extensive osteolysis and  probable pathologic fracture involving the distal aspect of the left 1st distal phalanx, consistent with osteomyelitis. 2. Stable chronic erosive changes involving the distal 2nd and 3rd phalanges. 3. Stable chronic degenerative changes in the midfoot and 1st metatarsophalangeal joint.  Radiographic exam LT foot 10/16/2024 Stable and essentially unchanged.  Erosions noted to the distal phalanx of the great toe.  No gas within the soft tissue.  Radiographic exam RT foot 08/20/2024 Normal osseous mineralization.  No acute fractures identified.  Posterior heel spur with calcifications and irregularity along the posterior tubercle of the calcaneus consistent with insertional Achilles tendinitis/tendinosis  Assessment: 1.  Ulcer left fifth toe; healed 2. diabetes mellitus w/ peripheral neuropathy 3.  Ulcer distal to the left great toe; healed 4.  Osteomyelitis left great toe; currently stable 5.  Achilles tendinitis right; improving  Plan of Care:  -Patient was evaluated.  - Continue conservative treatment and management for the Achilles tendinitis to the right lower extremity. -Recommend reduced activity and advised against walking uphill or treadmill with an incline.  Recommend light daily stretching exercises throughout the day which were demonstrated today - Continue meloxicam  15 mg daily PRN - Continue good supportive tennis shoes and sneakers -Return to clinic PRN  - Currently the ulcer to the great toe is healed and resolved completely.  No open wound noted. 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 Thresa EMERSON Sar, DPM    [address]  [place], [place] [address]                Office [phone]  Fax [phone]

## 2024-10-18 ENCOUNTER — Telehealth: Payer: Self-pay | Admitting: Podiatry

## 2024-10-18 NOTE — Telephone Encounter (Signed)
 Patient states [employer] called and told her they don't have any record of the extention from 09/28/24 therefore the claim is denied. She asks will you please submit paperwork as an extension from 09/28/24-10/30/24.Please confirm when this is done

## 2024-10-21 NOTE — Telephone Encounter (Signed)
 s/w pt and updating [employer] [place] and [employer] her RTW date is 11/27/24 per Dr. Janit. I will email her copies of updates.

## 2024-10-31 ENCOUNTER — Telehealth: Payer: Self-pay | Admitting: Podiatry

## 2024-10-31 NOTE — Telephone Encounter (Signed)
 Faxed [employer] [place] [phone] copy of last office notes and noted RTW 11/27/24

## 2024-11-15 ENCOUNTER — Telehealth: Payer: Self-pay

## 2024-11-15 NOTE — Telephone Encounter (Signed)
 Patient called and left a message on the nurse line. Current FMLA has her going back to work on 12/30. She would like to go back on 12/22 instead. She is asking that we adjust her FMLA and provide a note to go back 12/22. thanks [phone]

## 2024-11-18 ENCOUNTER — Telehealth: Payer: Self-pay | Admitting: Podiatry

## 2024-11-18 ENCOUNTER — Encounter: Payer: Self-pay | Admitting: Podiatry

## 2024-11-18 NOTE — Telephone Encounter (Signed)
 Pt lft mess that she is ready to RTW 11/18/24 and needs note sent. I faxed [employer] [place] and emailed pt copy of letter with revised RTW 11/18/24 instead of 11/27/24

## 2024-11-28 ENCOUNTER — Other Ambulatory Visit: Payer: Self-pay | Admitting: Nurse Practitioner

## 2024-11-28 DIAGNOSIS — E1159 Type 2 diabetes mellitus with other circulatory complications: Secondary | ICD-10-CM

## 2024-11-29 NOTE — Telephone Encounter (Signed)
 Requested Prescriptions  Pending Prescriptions Disp Refills   TRESIBA  FLEXTOUCH 100 UNIT/ML FlexTouch Pen [Pharmacy Med Name: Tresiba  FlexTouch 100 UNIT/ML Subcutaneous Solution Pen-injector] 15 mL 0    Sig: INJECT 60 UNITS SUBCUTANEOUSLY ONCE DAILY     Endocrinology:  Diabetes - Insulins Failed - 11/29/2024 12:11 PM      Failed - HBA1C is between 0 and 7.9 and within 180 days    Hgb A1c MFr Bld  Date Value Ref Range Status  06/01/2024 7.5 (H) 4.8 - 5.6 % Final    Comment:    (NOTE) Diagnosis of Diabetes The following HbA1c ranges recommended by the American Diabetes Association (ADA) may be used as an aid in the diagnosis of diabetes mellitus.  Hemoglobin             Suggested A1C NGSP%              Diagnosis  <5.7                   Non Diabetic  5.7-6.4                Pre-Diabetic  >6.4                   Diabetic  <7.0                   Glycemic control for                       adults with diabetes.           Passed - Valid encounter within last 6 months    Recent Outpatient Visits           5 months ago Osteomyelitis of great toe of left foot [facility])   [facility] Gareth Clarity F, FNP   6 months ago Diabetic ulcer of left great toe [facility])   [facility] Gareth Clarity F, FNP   8 months ago Type 2 diabetes mellitus with hyperglycemia, with long-term current use of insulin  [facility])   [facility] Gareth Clarity [place], [place]

## 2024-12-02 ENCOUNTER — Telehealth: Payer: Self-pay | Admitting: Lab

## 2024-12-02 ENCOUNTER — Telehealth: Payer: Self-pay | Admitting: Podiatry

## 2024-12-02 NOTE — Telephone Encounter (Signed)
 Please contact patient she is very upset about paperwork that was processed incorrect.

## 2024-12-02 NOTE — Telephone Encounter (Signed)
 cld pt bk and she is upset mess not coming to me via main# and prompt 5.I adv will have mgmt ck. She said her job is saying her RTW is 12/27/24. I adv her Dr Janit sent mess 11/27/24 was her date. She cld nurse line and said 11/18/24 she is ready no restric  I adv her faxed [employer] [place] and emailed her note with 11/18/24. I adv her would email her forms sent in Nov with 11/27/24 RTW date for her. We never had her going back in Jan. I adv her I emailed her both times and if date was incorrect she did not let me know. I adv her if I do forms 25 is due and she did not pay the fee last time(see billing of fees not paid). I can do the note(no charge) like I did  [employer] [place]. She said do the note. Sent mess for [facility] [place] to check line and why not rolling over to my line? Faxed [employer] [phone] work note with 11/18/24 date and no restrictions. If they req form she will have to pay 25.00. I adv her to call my direct line and that I spoke with her because she said she was still with her mom and could not the fee and that her son was sick- so I have talked to her, so I do call her back when I get a message from her.

## 2024-12-06 ENCOUNTER — Other Ambulatory Visit: Payer: Self-pay | Admitting: Nurse Practitioner

## 2024-12-06 DIAGNOSIS — E11621 Type 2 diabetes mellitus with foot ulcer: Secondary | ICD-10-CM

## 2024-12-06 NOTE — Telephone Encounter (Signed)
 Requested medication (s) are due for refill today: yes  Requested medication (s) are on the active medication list: yes  Last refill:  06/19/24  Future visit scheduled: yes  Notes to clinic:   Medication not assigned to a protocol, review manually.      Requested Prescriptions  Pending Prescriptions Disp Refills   MOUNJARO  12.5 MG/0.5ML Pen [Pharmacy Med Name: Mounjaro  12.5 MG/0.5ML Subcutaneous Solution Auto-injector] 12 mL 0    Sig: INJECT 12.5MG  SUBCUTANEOUSLY  ONCE A WEEK     Off-Protocol Failed - 12/06/2024  4:19 PM      Failed - Medication not assigned to a protocol, review manually.      Passed - Valid encounter within last 12 months    Recent Outpatient Visits           5 months ago Osteomyelitis of great toe of left foot [facility])    [facility] Gareth Clarity F, FNP   6 months ago Diabetic ulcer of left great toe [facility])   [facility] Gareth Clarity F, FNP   8 months ago Type 2 diabetes mellitus with hyperglycemia, with long-term current use of insulin  [facility])   [facility] Gareth Clarity [place], [place]

## 2024-12-10 ENCOUNTER — Telehealth: Payer: Self-pay | Admitting: Lab

## 2024-12-10 NOTE — Telephone Encounter (Signed)
 Patient is requesting to speak directly to provider not any of the staff.

## 2024-12-17 ENCOUNTER — Ambulatory Visit: Admitting: Podiatry

## 2024-12-17 ENCOUNTER — Encounter: Payer: Self-pay | Admitting: Podiatry

## 2024-12-17 ENCOUNTER — Ambulatory Visit

## 2024-12-17 VITALS — Ht 65.0 in | Wt 266.0 lb

## 2024-12-17 DIAGNOSIS — M7661 Achilles tendinitis, right leg: Secondary | ICD-10-CM | POA: Diagnosis not present

## 2024-12-17 NOTE — Progress Notes (Signed)
 "  Chief Complaint  Patient presents with   Foot Pain    Pt is here due to right achilles tendon pain , states some swelling as well, states she doesn't believes it healed all the way, stating that she is walking with a limp now.    Subjective:  54 y.o. female with PMHx of diabetes mellitus, last A1c 04/03/2024 was 7.4, presenting for follow-up evaluation of Achilles tendinitis to the right lower extremity.  She states that she is unable to pushoff with her foot and this is affecting her balance and gait.  She has also noticed swelling to the posterior aspect of the ankle   Past Medical History:  Diagnosis Date   Diabetes mellitus without complication (HCC)     Past Surgical History:  Procedure Laterality Date   CESAREAN SECTION     HERNIA REPAIR      No Known Allergies  Objective/Physical Exam General: The patient is alert and oriented x3 in no acute distress.  Dermatology:  The ulcer to the great toe has healed and resolved completely.  There is no open wound noted today.  No erythema.  There is some swelling along the Achilles tendon as it inserts on the posterior tubercle of the calcaneus  Vascular: Palpable pedal pulses bilaterally. No edema or erythema noted. Capillary refill within normal limits.  Neurological: Light touch and protective threshold diminished bilaterally.   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  Reduced muscle strength against resistance with plantarflexion of the ankle right and calf compression.  The Achilles tendon is somewhat enlarged and swollen however it is intact  DG Foot 2 Views Left 05/31/2024 IMPRESSION: 1. New extensive osteolysis and probable pathologic fracture involving the distal aspect of the left 1st distal phalanx, consistent with osteomyelitis. 2. Stable chronic erosive changes involving the distal 2nd and 3rd phalanges. 3. Stable chronic degenerative changes in the midfoot and  1st metatarsophalangeal joint.  Radiographic exam LT foot 10/16/2024 Stable and essentially unchanged.  Erosions noted to the distal phalanx of the great toe.  No gas within the soft tissue.  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  Posterior heel spur with calcifications and irregularity along the posterior tubercle of the calcaneus consistent with insertional Achilles tendinitis/tendinosis which has seemed to have some shifting and change compared to prior x-rays  Assessment: 1.  Diabetes mellitus w/ peripheral neuropathy 2.  H/o Ulcer distal to the left great toe; healed 3.  H/o Osteomyelitis left great toe; currently stable 4.  Achilles tendinitis/tendinosis right  Plan of Care:  -Patient was evaluated.  X-rays reviewed and compared to prior x-rays - Unfortunately patient continues to have swelling with pain and tenderness to the Achilles tendon of the right lower extremity.  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 Thresa HERO.  Janit, DPM    [address] [place], [place] [address]                Office [phone]  Fax [phone]    Airline pilot "

## 2024-12-20 ENCOUNTER — Telehealth: Payer: Self-pay | Admitting: Podiatry

## 2024-12-20 NOTE — Telephone Encounter (Signed)
 Faxed [employer] [phone] new form/notes approx RTW per Dr. Janit 02/17/25.

## 2024-12-20 NOTE — Telephone Encounter (Signed)
 Faxed [employer] [phone] form/notes as well

## 2024-12-24 ENCOUNTER — Telehealth: Payer: Self-pay | Admitting: Podiatry

## 2024-12-24 NOTE — Telephone Encounter (Signed)
 Faxed [employer] [phone] 12/21/23

## 2024-12-24 NOTE — Telephone Encounter (Signed)
 Updated [employer] forms and refaxed to [phone] Leave 12/01/24- not 12/17/24(last visit date)-02/17/25

## 2024-12-25 ENCOUNTER — Inpatient Hospital Stay
Admission: RE | Admit: 2024-12-25 | Discharge: 2024-12-25 | Disposition: A | Source: Ambulatory Visit | Attending: Podiatry

## 2024-12-25 DIAGNOSIS — M7661 Achilles tendinitis, right leg: Secondary | ICD-10-CM
